# Patient Record
Sex: Female | Born: 1970 | Hispanic: Yes | Marital: Married | Smoking: Never smoker
Health system: Southern US, Community
[De-identification: ages and names within clinical notes are randomized; demographics above are authoritative.]

## PROBLEM LIST (undated history)

## (undated) DIAGNOSIS — E119 Type 2 diabetes mellitus without complications: Secondary | ICD-10-CM

## (undated) HISTORY — DX: Type 2 diabetes mellitus without complications: E11.9

---

## 2006-04-09 ENCOUNTER — Emergency Department: Payer: Self-pay | Admitting: Emergency Medicine

## 2006-06-05 ENCOUNTER — Emergency Department: Payer: Self-pay | Admitting: Emergency Medicine

## 2009-06-13 HISTORY — PX: GALLBLADDER SURGERY: SHX652

## 2013-03-13 ENCOUNTER — Ambulatory Visit: Payer: Self-pay

## 2013-04-26 ENCOUNTER — Ambulatory Visit: Payer: Self-pay

## 2013-10-08 ENCOUNTER — Ambulatory Visit: Payer: Self-pay | Admitting: Internal Medicine

## 2013-10-25 ENCOUNTER — Ambulatory Visit: Payer: Self-pay

## 2015-01-23 IMAGING — US US BREAST*L* LIMITED INC AXILLA
1 series · 7 of 7 positions shown · non-contrast
Comparison: 04/26/2013, 03/13/2013

CLINICAL DATA: Six-month followup of probably benign left breast
masses

EXAM:
DIGITAL DIAGNOSTIC  LEFT MAMMOGRAM WITH CAD
ULTRASOUND LEFT BREAST

[Series 1: us breast*left* limited inc axilla · 0.08mm/px · 7 of 7 slices shown]
[im 1/7]
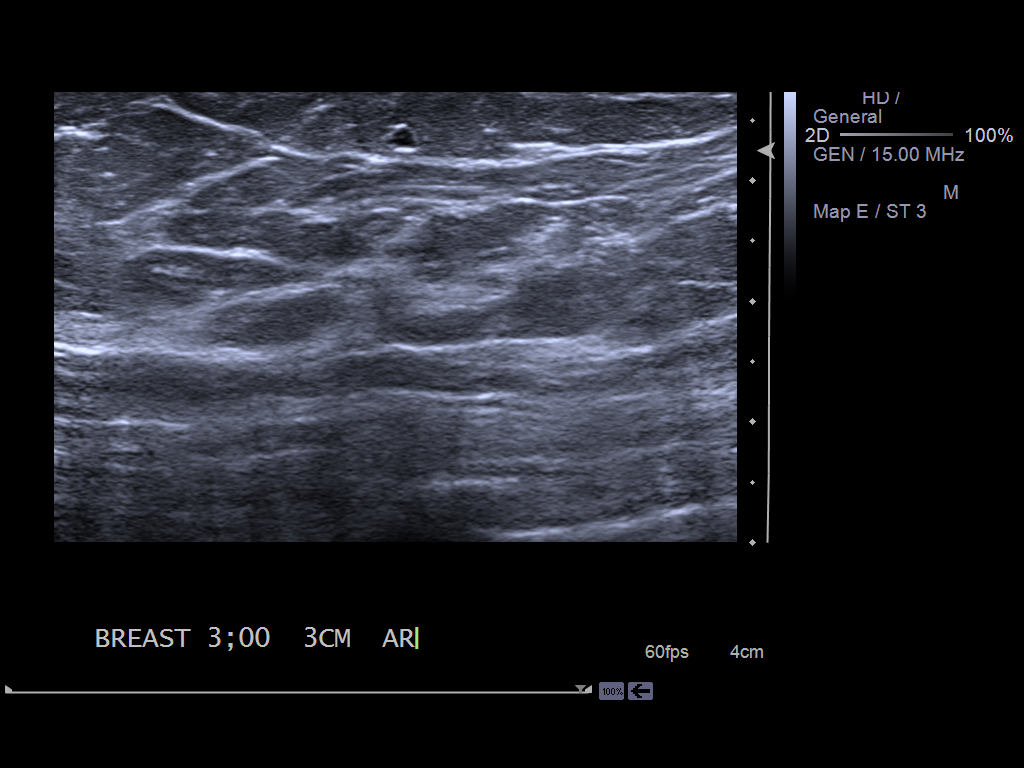
[im 2/7]
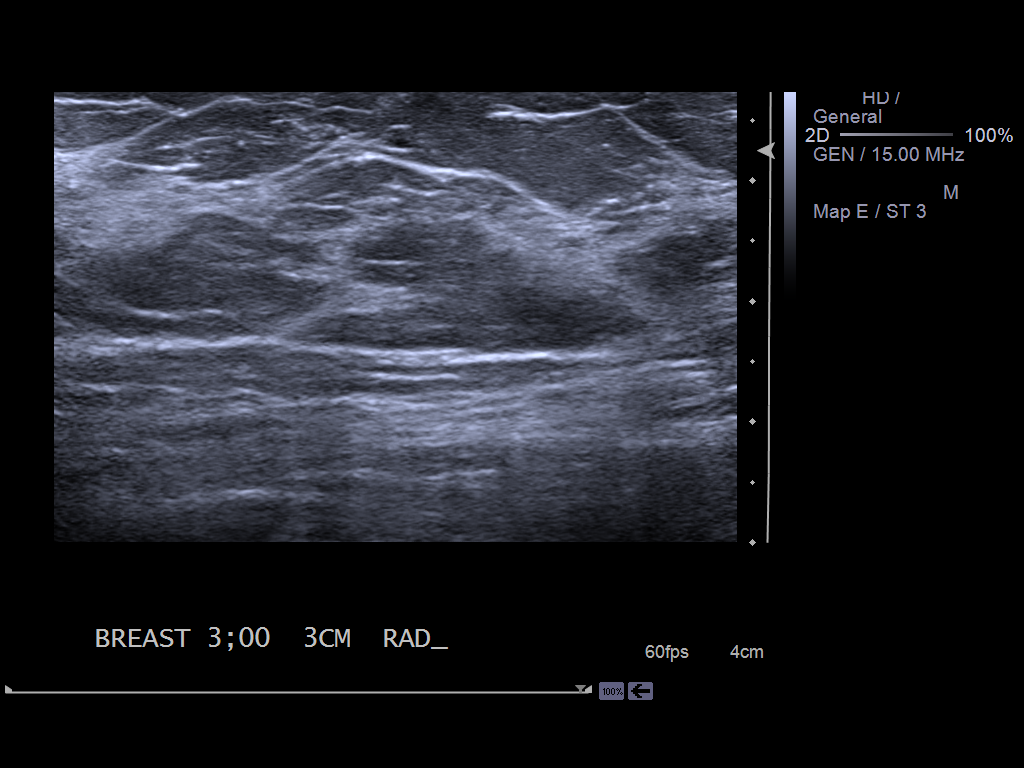
[im 3/7]
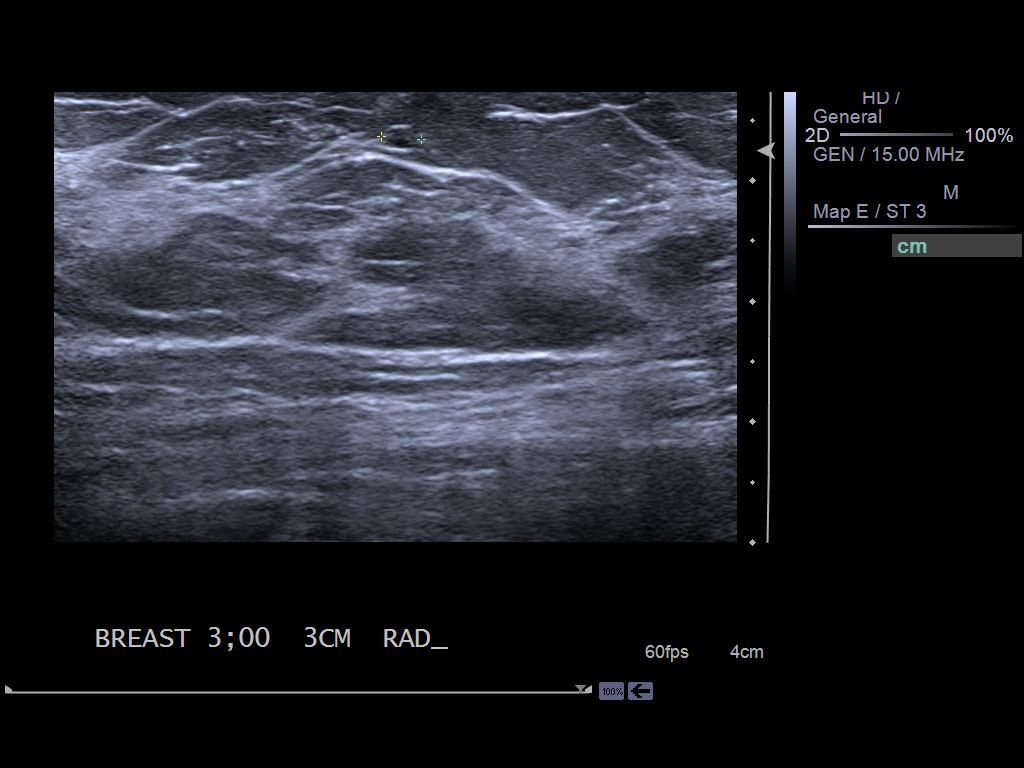
[im 4/7]
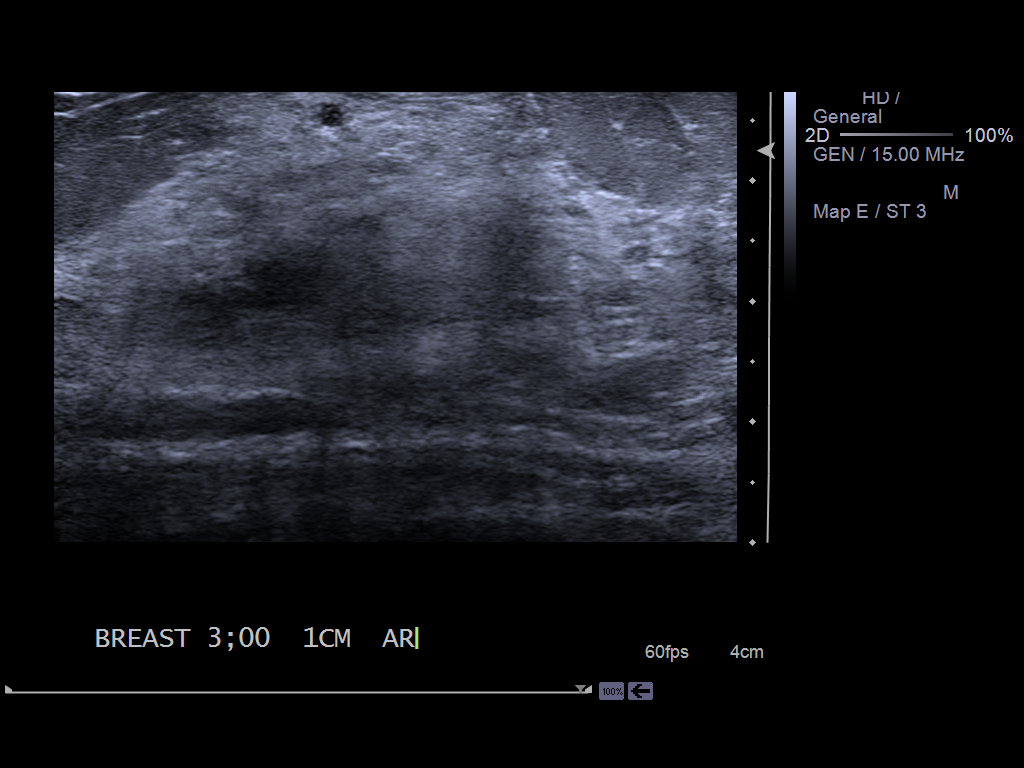
[im 5/7]
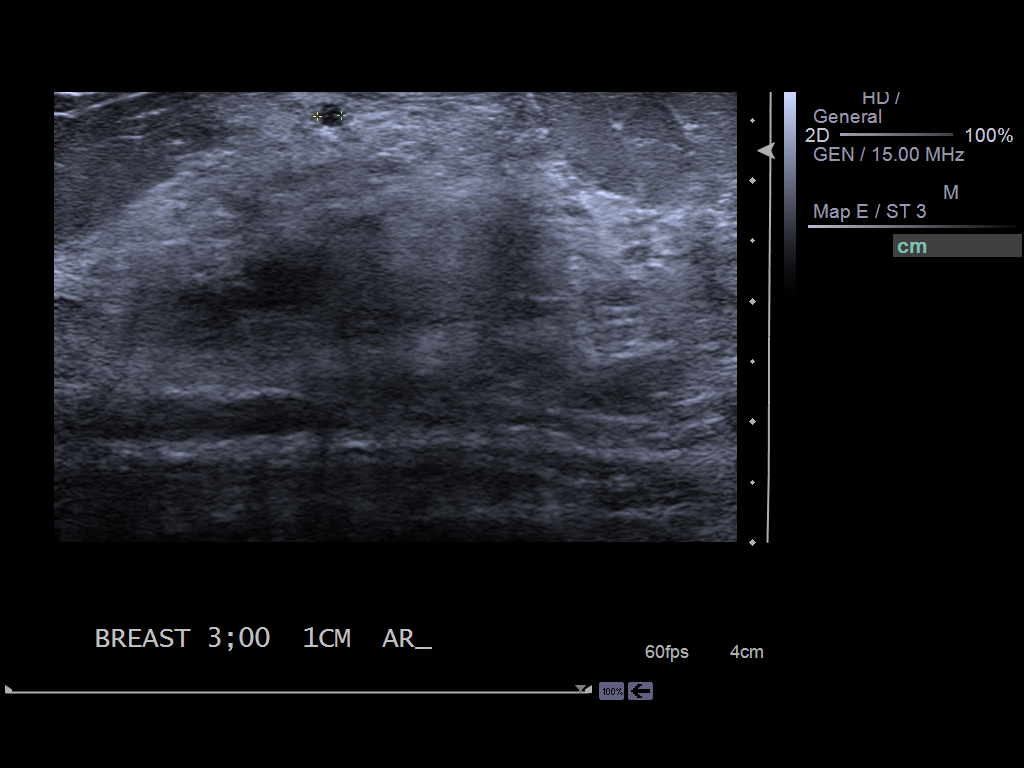
[im 6/7]
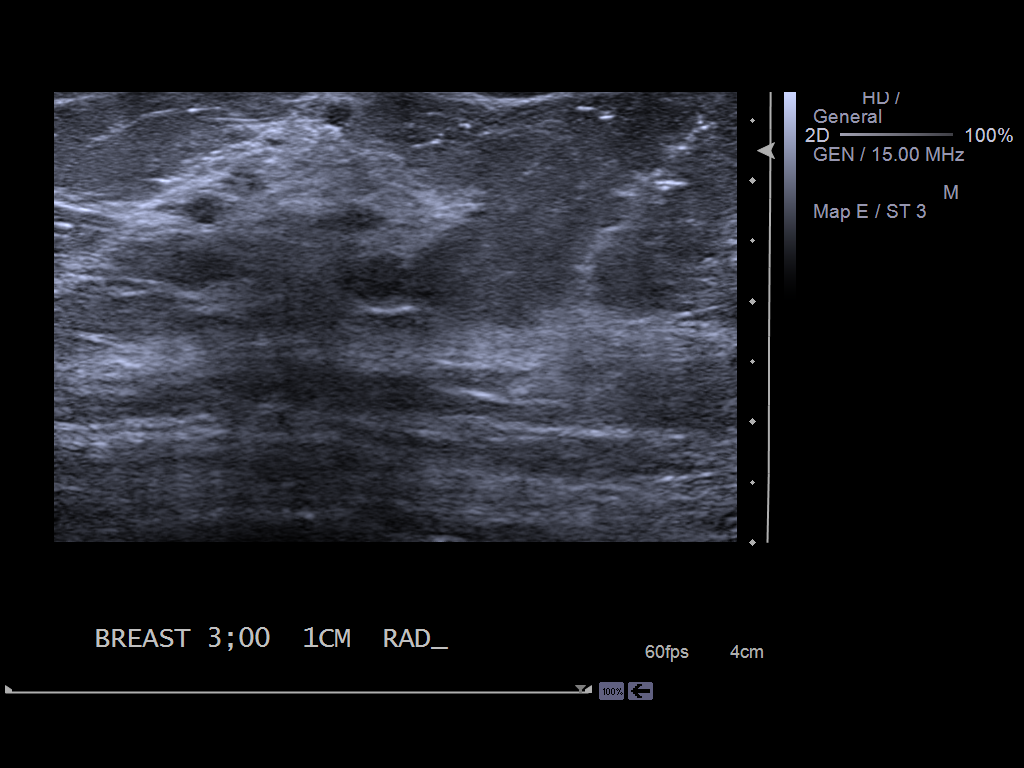
[im 7/7]
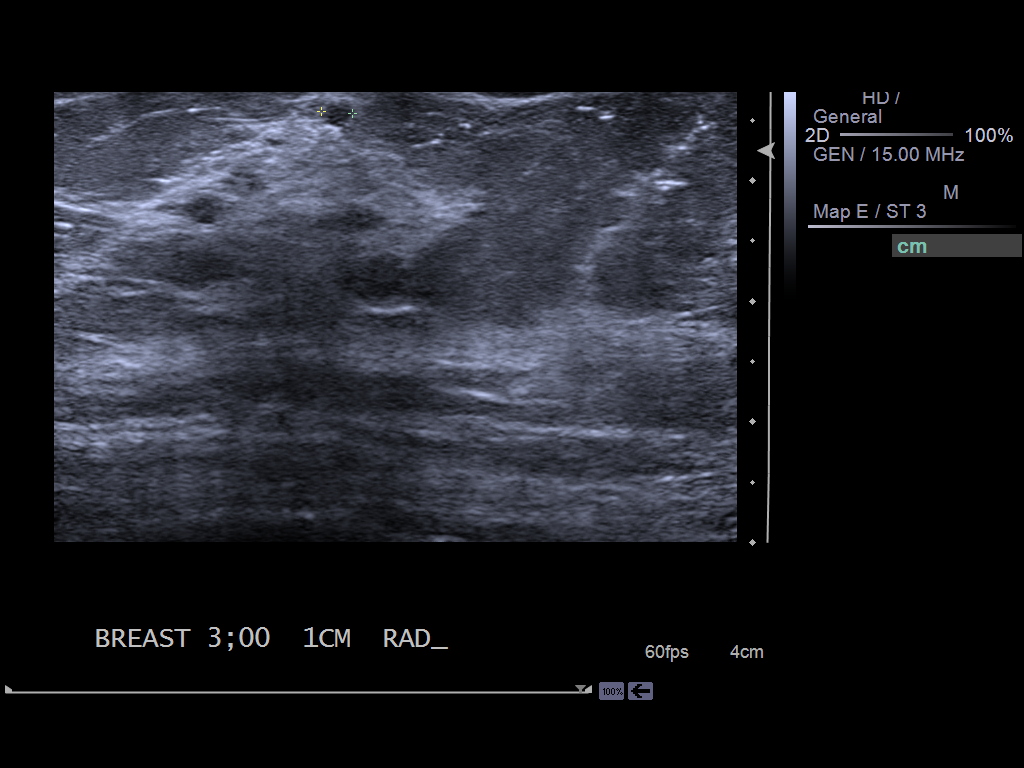

[7 of 7 positions shown; findings below may reference images not displayed]

ACR Breast Density Category b: There are scattered areas of
fibroglandular density.
FINDINGS: Left mammogram is negative. Small upper-outer obscured mass seen on
prior mammogram not seen on the current mammogram.

Mammographic images were processed with CAD.

On physical exam, there are no palpable abnormalities.

Ultrasound is performed, showing fibrocystic change throughout the
left upper outer quadrant. 1 cm from the nipple in 3 o'clock
position of the left breast is a tiny hypoechoic nodule measuring 3
mm in diameter. 3 cm from the nipple is another tiny hypoechoic
nodule measuring 3 mm in diameter. These nodules appear most
consistent with tiny given increased sound transmission,
circumscribed borders, and well-defined echogenic back wall.
IMPRESSION: Findings most consistent with 2 stable tiny minimally complicated
cysts on a background of fibrocystic change.

RECOMMENDATION:
Diagnostic bilateral mammogram and left ultrasound in 1 year

I have discussed the findings and recommendations with the patient.
Results were also provided in writing at the conclusion of the
visit. If applicable, a reminder letter will be sent to the patient
regarding the next appointment.

BI-RADS CATEGORY  3: Probably benign.

## 2021-10-22 ENCOUNTER — Ambulatory Visit (INDEPENDENT_AMBULATORY_CARE_PROVIDER_SITE_OTHER): Payer: 59 | Admitting: Nurse Practitioner

## 2021-10-22 ENCOUNTER — Encounter: Payer: Self-pay | Admitting: Nurse Practitioner

## 2021-10-22 VITALS — BP 138/79 | HR 93 | Temp 98.4°F | Resp 16 | Ht 60.0 in | Wt 139.4 lb

## 2021-10-22 DIAGNOSIS — E538 Deficiency of other specified B group vitamins: Secondary | ICD-10-CM

## 2021-10-22 DIAGNOSIS — Z7689 Persons encountering health services in other specified circumstances: Secondary | ICD-10-CM

## 2021-10-22 DIAGNOSIS — E559 Vitamin D deficiency, unspecified: Secondary | ICD-10-CM | POA: Diagnosis not present

## 2021-10-22 DIAGNOSIS — E1165 Type 2 diabetes mellitus with hyperglycemia: Secondary | ICD-10-CM

## 2021-10-22 DIAGNOSIS — N920 Excessive and frequent menstruation with regular cycle: Secondary | ICD-10-CM | POA: Diagnosis not present

## 2021-10-22 DIAGNOSIS — D259 Leiomyoma of uterus, unspecified: Secondary | ICD-10-CM

## 2021-10-22 DIAGNOSIS — E782 Mixed hyperlipidemia: Secondary | ICD-10-CM

## 2021-10-22 DIAGNOSIS — E119 Type 2 diabetes mellitus without complications: Secondary | ICD-10-CM

## 2021-10-22 LAB — POCT GLYCOSYLATED HEMOGLOBIN (HGB A1C): Hemoglobin A1C: 11.6 % — AB (ref 4.0–5.6)

## 2021-10-22 MED ORDER — GLIPIZIDE ER 10 MG PO TB24: 10.0000 mg | ORAL_TABLET | Freq: Every day | ORAL | 3 refills | Status: DC

## 2021-10-22 MED ORDER — METFORMIN HCL ER 750 MG PO TB24: 750.0000 mg | ORAL_TABLET | Freq: Every day | ORAL | 5 refills | Status: DC

## 2021-10-22 MED ORDER — ACCU-CHEK GUIDE W/DEVICE KIT: 1.0000 | PACK | Freq: Once | 0 refills | Status: AC

## 2021-10-22 NOTE — Progress Notes (Signed)
[facility] [place], [place] [address]  Internal MEDICINE  Office Visit Note  Patient Name: Grace Pena  [id number]  [id number]  Date of Service: 10/22/2021   Complaints/HPI Pt is here for establishment of PCP. Chief Complaint  Patient presents with   New Patient (Initial Visit)   Menstrual Problem    Has very painful, inconsistent periods   Quality Metric Gaps    Colonoscopy, [place]   Diabetes    Has dry mouth, needs water constantly   HPI Grace Pena presents for a new patient visit to establish care.  She is a well-appearing 51 year old female with diabetes and inconsistent medical care.  The patient is Spanish-speaking only and is accompanied by her daughter to the visit today who assisted in translation and interpretation.  The patient has no other significant pre-existing medical conditions and the only surgeries that she has had is 1 cesarean section and a cholecystectomy.  The patient reports that she was previously diagnosed with type 2 diabetes a long time ago and states that it went away and she feels like it has come back now.  She reports persistent increased thirst, increased urination and increased appetite and feeling hungry all the time.  She also reports intermittent headaches, and dry mouth.  A another problem that she has been having is painful periods with excessive bleeding during her cycle.  She reports that her periods have been regular except for a 18-monthperiod Around October last year that she did not have a period.  She reports that it lasts 4 to 5 days and that she saturates a pad every 2-3 hours and this has increased in the past few months.  She did go to the ER in January for abdominal pain and a CT scan of the abdomen and pelvis was done and it was grossly unremarkable except for a fibroid uterus and a crenulated follicle in the left ovary.  The pain that the patient is reporting she states is on the left lower abdomen and it  radiates around her left side to the left flank and back.  The CT scan was done in January so further imaging will be needed to assess the area and possibly identify specific fibroids and to see if the crenulated follicle is still in the left ovary. The patient is also due for routine labs, routine physical exam and Pap smear.  She is also due for colorectal cancer screening which will be just addressed at a later follow-up visit. Her A1c is 11.6 today which is significantly elevated and she is currently on no medications to control her diabetes.  She was given a prescription for metformin from the ER in January but she is out of that now.  She has never tried any other medications and she does not have a glucose meter at home to check her sugars with.    Current Medication: Outpatient Encounter Medications as of 10/22/2021  Medication Sig   [EXPIRED] Blood Glucose Monitoring Suppl (ACCU-CHEK GUIDE) w/Device KIT 1 Device by Does not apply route once for 1 dose.   glipiZIDE (GLUCOTROL XL) 10 MG 24 hr tablet Take 1 tablet (10 mg total) by mouth daily with breakfast.   metFORMIN (GLUCOPHAGE-XR) 750 MG 24 hr tablet Take 1 tablet (750 mg total) by mouth daily with breakfast.   No facility-administered encounter medications on file as of 10/22/2021.    Surgical History: Past Surgical History:  Procedure Laterality Date   CESAREAN SECTION  GALLBLADDER SURGERY  2011    Medical History: Past Medical History:  Diagnosis Date   Diabetes mellitus without complication ([place])     Family History: Family History  Problem Relation Age of Onset   Diabetes Mother    Diabetes Maternal Grandmother     Social History   Socioeconomic History   Marital status: Married    Spouse name: Not on file   Number of children: Not on file   Years of education: Not on file   Highest education level: Not on file  Occupational History   Not on file  Tobacco Use   Smoking status: Never   Smokeless tobacco:  Never  Substance and Sexual Activity   Alcohol use: Never   Drug use: Never   Sexual activity: Not on file  Other Topics Concern   Not on file  Social History Narrative   Not on file   Social Determinants of Health   Financial Resource Strain: Not on file  Food Insecurity: Not on file  Transportation Needs: Not on file  Physical Activity: Not on file  Stress: Not on file  Social Connections: Not on file  Intimate Partner Violence: Not on file     Review of Systems  Constitutional:  Positive for appetite change and fatigue. Negative for chills and unexpected weight change.  HENT:  Negative for congestion, postnasal drip, rhinorrhea, sneezing and sore throat.   Eyes:  Positive for visual disturbance. Negative for redness.  Respiratory:  Negative for cough, chest tightness, shortness of breath and wheezing.   Cardiovascular: Negative.  Negative for chest pain and palpitations.  Gastrointestinal:  Negative for abdominal pain, constipation, diarrhea, nausea and vomiting.  Endocrine: Positive for polydipsia, polyphagia and polyuria.  Genitourinary:  Positive for frequency, menstrual problem and pelvic pain. Negative for dysuria.  Musculoskeletal:  Negative for arthralgias, back pain, joint swelling and neck pain.  Skin:  Negative for rash.  Neurological:  Positive for dizziness, light-headedness and headaches. Negative for tremors and numbness.  Psychiatric/Behavioral:  Negative for behavioral problems (Depression), sleep disturbance and suicidal ideas. The patient is not nervous/anxious.    Vital Signs: BP 138/79   Pulse 93   Temp 98.4 F (36.9 C)   Resp 16   Ht 5' (1.524 m)   Wt 139 lb 6.4 oz (63.2 kg)   LMP 10/07/2021 (Approximate)   SpO2 100%   BMI 27.22 kg/m    Physical Exam Vitals reviewed.  Constitutional:      General: She is not in acute distress.    Appearance: Normal appearance. She is obese. She is not ill-appearing.  HENT:     Head: Normocephalic and  atraumatic.  Eyes:     Pupils: Pupils are equal, round, and reactive to light.  Cardiovascular:     Rate and Rhythm: Normal rate and regular rhythm.  Pulmonary:     Effort: Pulmonary effort is normal. No respiratory distress.  Neurological:     Mental Status: She is alert and oriented to person, place, and time.  Psychiatric:        Mood and Affect: Mood normal.        Behavior: Behavior normal.      Assessment/Plan: 1. Uncontrolled type 2 diabetes mellitus with hyperglycemia ([place]) Patient has poorly controlled diabetes type 2.  Her A1c is 11.6 at today's office visit.  She was given a prescription for metformin in the ER in January but has not had any further medication since that prescription ran out.  Patient  does need medication to help control her blood glucose levels and ideally insulin due to the high A1c. Metformin extended release and glipizide extended release prescribed, patient will follow-up in 3 weeks and bring glucose log and will discuss adding a basal insulin for now. - POCT glycosylated hemoglobin (Hb A1C) - CMP14+EGFR - TSH + free T4 - metFORMIN (GLUCOPHAGE-XR) 750 MG 24 hr tablet; Take 1 tablet (750 mg total) by mouth daily with breakfast.  Dispense: 30 tablet; Refill: 5 - glipiZIDE (GLUCOTROL XL) 10 MG 24 hr tablet; Take 1 tablet (10 mg total) by mouth daily with breakfast.  Dispense: 30 tablet; Refill: 3 - Blood Glucose Monitoring Suppl (ACCU-CHEK GUIDE) w/Device KIT; 1 Device by Does not apply route once for 1 dose.  Dispense: 1 kit; Refill: 0  2. Menorrhagia with regular cycle Pelvic ultrasound and additional lab ordered to further evaluate excessive bleeding with regular cycle. - US PELVIS (TRANSABDOMINAL ONLY); Future - CBC with Differential/Platelet - CMP14+EGFR - B12 and Folate Panel - Iron, TIBC and Ferritin Panel - TSH + free T4  3. Uterine leiomyoma, unspecified location CT abdomen pelvis was done in the ER in January and a fibroid uterus was  identified, patient is having excessive menstrual bleeding during her cycle, ultrasound of the pelvis ordered for further evaluation.  We will consider referral to gynecology if needed. - US PELVIS (TRANSABDOMINAL ONLY); Future  4. Vitamin D deficiency Routine labs ordered - Vitamin D (25 hydroxy) - TSH + free T4  5. Mixed hyperlipidemia Routine labs ordered - CMP14+EGFR - Lipid Profile - TSH + free T4  6. B12 deficiency Routine labs - B12 and Folate Panel - Iron, TIBC and Ferritin Panel - TSH + free T4  7. Encounter to establish care with new doctor Patient establishing care, needs consistent diabetes management and further evaluation menorrhagia.  Has not had a consistent PCP recently    General Counseling: Grace Pena understanding of the findings of todays visit and agrees with plan of treatment. I have discussed any further diagnostic evaluation that may be needed or ordered today. We also reviewed her medications today. she has been encouraged to call the office with any questions or concerns that should arise related to todays visit.    Counseling:  [place] Controlled Substance Database was reviewed by me.  Orders Placed This Encounter  Procedures   US PELVIS (TRANSABDOMINAL ONLY)   CBC with Differential/Platelet   CMP14+EGFR   Lipid Profile   Vitamin D (25 hydroxy)   B12 and Folate Panel   Iron, TIBC and Ferritin Panel   TSH + free T4   POCT glycosylated hemoglobin (Hb A1C)    Meds ordered this encounter  Medications   metFORMIN (GLUCOPHAGE-XR) 750 MG 24 hr tablet    Sig: Take 1 tablet (750 mg total) by mouth daily with breakfast.    Dispense:  30 tablet    Refill:  5    Dx code E11.65   glipiZIDE (GLUCOTROL XL) 10 MG 24 hr tablet    Sig: Take 1 tablet (10 mg total) by mouth daily with breakfast.    Dispense:  30 tablet    Refill:  3    Dx code E11.65   Blood Glucose Monitoring Suppl (ACCU-CHEK GUIDE) w/Device KIT    Sig: 1 Device by Does not  apply route once for 1 dose.    Dispense:  1 kit    Refill:  0    E11.65 dx code, please provide 1 box of strips  and 1 box of lancets if possible, we can also send order for those.    Return in about 3 weeks (around 11/12/2021) for F/U, eval new med and glucose log, Ryn Peine PCP.  Time spent:30 Minutes Time spent with patient included reviewing progress notes, labs, imaging studies, and discussing plan for follow up.   [place] Controlled Substance Database was reviewed by me for overdose risk score (ORS)   This patient was seen by Jonetta Osgood, FNP-C in collaboration with Dr. Clayborn Bigness as a part of collaborative care agreement.    Kutter Schnepf R. Valetta Fuller, MSN, FNP-C Internal Medicine

## 2021-10-23 ENCOUNTER — Encounter: Payer: Self-pay | Admitting: Nurse Practitioner

## 2021-11-12 ENCOUNTER — Ambulatory Visit: Payer: 59 | Admitting: Nurse Practitioner

## 2021-11-15 ENCOUNTER — Ambulatory Visit: Payer: 59

## 2021-11-15 DIAGNOSIS — D259 Leiomyoma of uterus, unspecified: Secondary | ICD-10-CM

## 2021-11-15 DIAGNOSIS — N920 Excessive and frequent menstruation with regular cycle: Secondary | ICD-10-CM | POA: Diagnosis not present

## 2021-11-19 ENCOUNTER — Encounter: Payer: Self-pay | Admitting: Nurse Practitioner

## 2021-11-25 ENCOUNTER — Ambulatory Visit (INDEPENDENT_AMBULATORY_CARE_PROVIDER_SITE_OTHER): Payer: 59 | Admitting: Nurse Practitioner

## 2021-11-25 ENCOUNTER — Encounter: Payer: Self-pay | Admitting: Nurse Practitioner

## 2021-11-25 VITALS — BP 135/75 | HR 98 | Temp 98.5°F | Resp 16 | Ht 61.0 in | Wt 143.2 lb

## 2021-11-25 DIAGNOSIS — D259 Leiomyoma of uterus, unspecified: Secondary | ICD-10-CM

## 2021-11-25 DIAGNOSIS — M5440 Lumbago with sciatica, unspecified side: Secondary | ICD-10-CM | POA: Diagnosis not present

## 2021-11-25 DIAGNOSIS — E1165 Type 2 diabetes mellitus with hyperglycemia: Secondary | ICD-10-CM | POA: Diagnosis not present

## 2021-11-25 DIAGNOSIS — N926 Irregular menstruation, unspecified: Secondary | ICD-10-CM | POA: Diagnosis not present
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
## 2021-12-02 ENCOUNTER — Encounter: Payer: Self-pay | Admitting: Obstetrics and Gynecology

## 2021-12-02 ENCOUNTER — Ambulatory Visit (INDEPENDENT_AMBULATORY_CARE_PROVIDER_SITE_OTHER): Payer: 59 | Admitting: Obstetrics and Gynecology

## 2021-12-02 VITALS — BP 130/86 | HR 112 | Ht 61.0 in | Wt 139.2 lb

## 2021-12-02 DIAGNOSIS — N926 Irregular menstruation, unspecified: Secondary | ICD-10-CM

## 2021-12-02 DIAGNOSIS — Z7689 Persons encountering health services in other specified circumstances: Secondary | ICD-10-CM

## 2021-12-02 DIAGNOSIS — D219 Benign neoplasm of connective and other soft tissue, unspecified: Secondary | ICD-10-CM | POA: Diagnosis not present

## 2021-12-02 DIAGNOSIS — N951 Menopausal and female climacteric states: Secondary | ICD-10-CM

## 2021-12-02 DIAGNOSIS — Z780 Asymptomatic menopausal state: Secondary | ICD-10-CM | POA: Diagnosis not present

## 2021-12-02 NOTE — Progress Notes (Signed)
Patient presents today due to irregular bleeding, cramping and fibroids. She states over the past year she has gone months without having a period and then it will start again very heavily with severe cramping. History of BTL. No other questions at this time.

## 2021-12-03 LAB — FOLLICLE STIMULATING HORMONE: FSH: 8.4 m[IU]/mL

## 2021-12-21 ENCOUNTER — Encounter: Payer: Self-pay | Admitting: Obstetrics and Gynecology

## 2021-12-21 ENCOUNTER — Ambulatory Visit (INDEPENDENT_AMBULATORY_CARE_PROVIDER_SITE_OTHER): Payer: 59 | Admitting: Obstetrics and Gynecology

## 2021-12-21 VITALS — BP 125/83 | HR 97 | Ht 61.0 in | Wt 136.5 lb

## 2021-12-21 DIAGNOSIS — N926 Irregular menstruation, unspecified: Secondary | ICD-10-CM | POA: Diagnosis not present

## 2021-12-21 DIAGNOSIS — N951 Menopausal and female climacteric states: Secondary | ICD-10-CM

## 2021-12-21 DIAGNOSIS — D219 Benign neoplasm of connective and other soft tissue, unspecified: Secondary | ICD-10-CM | POA: Diagnosis not present

## 2021-12-21 MED ORDER — DESOGESTREL-ETHINYL ESTRADIOL 0.15-0.02/0.01 MG (21/5) PO TABS: 1.0000 | ORAL_TABLET | Freq: Every day | ORAL | 3 refills | Status: DC

## 2021-12-21 NOTE — Progress Notes (Signed)
Patient presents to follow-up on recent labwork. She states her last menstrual cycle was very light and short, typically she bleeds heavily for 4 days with cramping. Patient states no current menopausal symptoms. No other questions or concerns.

## 2021-12-21 NOTE — Progress Notes (Signed)
HPI:      [name] is a 51 y.o. [phone] who LMP was Patient's last menstrual period was 12/04/2021 (approximate).  Subjective:   She presents today to follow-up after being seen for menopausal symptoms and irregular cycles.  Her [place] was found to be normal-not menopausal.  She is still somewhat uncomfortable with her menopausal symptoms and would like something to improve these if possible.  She has had a menstrual period since her last visit here but prior to that her cycles were irregular.    Hx: The following portions of the patient's history were reviewed and updated as appropriate:             She  has a past medical history of Diabetes mellitus without complication ([place]). She does not have a problem list on file. She  has a past surgical history that includes Cesarean section and Gallbladder surgery (2011). Her family history includes Diabetes in her maternal grandmother and mother. She  reports that she has never smoked. She has never used smokeless tobacco. She reports that she does not drink alcohol and does not use drugs. She has a current medication list which includes the following prescription(s): desogestrel-ethinyl estradiol, glipizide, metformin, and prednisolone. She has no allergies on file.       Review of Systems:  Review of Systems  Constitutional: Denied constitutional symptoms, night sweats, recent illness, fatigue, fever, insomnia and weight loss.  Eyes: Denied eye symptoms, eye pain, photophobia, vision change and visual disturbance.  Ears/Nose/Throat/Neck: Denied ear, nose, throat or neck symptoms, hearing loss, nasal discharge, sinus congestion and sore throat.  Cardiovascular: Denied cardiovascular symptoms, arrhythmia, chest pain/pressure, edema, exercise intolerance, orthopnea and palpitations.  Respiratory: Denied pulmonary symptoms, asthma, pleuritic pain, productive sputum, cough, dyspnea and wheezing.  Gastrointestinal: Denied,  gastro-esophageal reflux, melena, nausea and vomiting.  Genitourinary: See HPI for additional information.  Musculoskeletal: Denied musculoskeletal symptoms, stiffness, swelling, muscle weakness and myalgia.  Dermatologic: Denied dermatology symptoms, rash and scar.  Neurologic: Denied neurology symptoms, dizziness, headache, neck pain and syncope.  Psychiatric: Denied psychiatric symptoms, anxiety and depression.  Endocrine: See HPI for additional information.   Meds:   Current Outpatient Medications on File Prior to Visit  Medication Sig Dispense Refill   glipiZIDE (GLUCOTROL XL) 10 MG 24 hr tablet Take 1 tablet (10 mg total) by mouth daily with breakfast. 30 tablet 3   metFORMIN (GLUCOPHAGE-XR) 750 MG 24 hr tablet Take 1 tablet (750 mg total) by mouth daily with breakfast. 30 tablet 5   PREDNISOLONE PO Take 4 mg by mouth.     No current facility-administered medications on file prior to visit.      Objective:     Vitals:   12/21/21 1051  BP: 125/83  Pulse: 97   Filed Weights   12/21/21 1051  Weight: 136 lb 8 oz (61.9 kg)              FSH not consistent with menopause          Assessment:    G3P3003 There are no problems to display for this patient.    1. Irregular menstruation   2. Fibroids   3. Symptomatic menopausal or female climacteric states     I discussed her [place] findings with her.  I believe based on this and her symptoms and her irregular cycles that she is likely in climacteric.  She would like to do something to fix her irregular cycles but especially her menopausal symptoms.  Plan:            1.  We will fix cycles with OCPs. OCPs The risks /benefits of OCPs have been explained to the patient in detail.  Product literature has been given to her where appropriate.  I have instructed her in the use of OCPs.  I have explained to the patient that OCPs are not as effective for birth control during the first month of use, and that another form of  contraception should be used during this time.  Both first-day start and Sunday start have been explained.  The risks and benefits of each was discussed.  She has been made aware of  the fact that in rare circumstances, other medications may affect the efficacy of OCPs.  I have answered all of her questions, and I believe that she has an understanding of the effectiveness and use of OCPs. We will start [place]. Orders No orders of the defined types were placed in this encounter.    Meds ordered this encounter  Medications   desogestrel-ethinyl estradiol (MIRCETTE) 0.15-0.02/0.01 MG (21/5) tablet    Sig: Take 1 tablet by mouth at bedtime.    Dispense:  28 tablet    Refill:  3      F/U  Return in about 3 months (around 03/23/2022).  Finis Bud, M.D. 12/21/2021 11:34 AM

## 2022-01-16 ENCOUNTER — Encounter: Payer: Self-pay | Admitting: Nurse Practitioner

## 2022-01-20 ENCOUNTER — Ambulatory Visit: Payer: 59 | Admitting: Nurse Practitioner

## 2022-01-25 ENCOUNTER — Ambulatory Visit (INDEPENDENT_AMBULATORY_CARE_PROVIDER_SITE_OTHER): Payer: 59 | Admitting: Nurse Practitioner

## 2022-01-25 ENCOUNTER — Encounter: Payer: Self-pay | Admitting: Nurse Practitioner

## 2022-01-25 VITALS — BP 138/80 | HR 120 | Temp 98.4°F | Resp 16 | Ht 61.0 in | Wt 134.0 lb

## 2022-01-25 DIAGNOSIS — Z1231 Encounter for screening mammogram for malignant neoplasm of breast: Secondary | ICD-10-CM

## 2022-01-25 DIAGNOSIS — E1165 Type 2 diabetes mellitus with hyperglycemia: Secondary | ICD-10-CM

## 2022-01-25 DIAGNOSIS — R300 Dysuria: Secondary | ICD-10-CM | POA: Diagnosis not present

## 2022-01-25 DIAGNOSIS — Z0001 Encounter for general adult medical examination with abnormal findings: Secondary | ICD-10-CM | POA: Diagnosis not present

## 2022-01-25 DIAGNOSIS — Z1211 Encounter for screening for malignant neoplasm of colon: Secondary | ICD-10-CM

## 2022-01-25 DIAGNOSIS — Z1212 Encounter for screening for malignant neoplasm of rectum: Secondary | ICD-10-CM

## 2022-01-25 DIAGNOSIS — M5440 Lumbago with sciatica, unspecified side: Secondary | ICD-10-CM | POA: Diagnosis not present

## 2022-01-25 LAB — POCT GLYCOSYLATED HEMOGLOBIN (HGB A1C): Hemoglobin A1C: 6.7 % — AB (ref 4.0–5.6)

## 2022-01-25 MED ORDER — METOPROLOL SUCCINATE ER 25 MG PO TB24: 25.0000 mg | ORAL_TABLET | Freq: Every day | ORAL | 2 refills | Status: DC

## 2022-01-25 MED ORDER — AMLODIPINE BESYLATE 2.5 MG PO TABS: 2.5000 mg | ORAL_TABLET | Freq: Every day | ORAL | 2 refills | Status: DC

## 2022-01-25 MED ORDER — CYCLOBENZAPRINE HCL 5 MG PO TABS: 5.0000 mg | ORAL_TABLET | Freq: Every evening | ORAL | 0 refills | Status: DC | PRN
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
## 2022-01-28 LAB — UA/M W/RFLX CULTURE, ROUTINE

## 2022-02-03 ENCOUNTER — Ambulatory Visit (INDEPENDENT_AMBULATORY_CARE_PROVIDER_SITE_OTHER): Payer: 59 | Admitting: Internal Medicine

## 2022-02-03 ENCOUNTER — Encounter: Payer: Self-pay | Admitting: Internal Medicine

## 2022-02-03 VITALS — BP 119/78 | HR 109 | Temp 97.8°F | Resp 16 | Ht 61.0 in | Wt 134.0 lb

## 2022-02-03 DIAGNOSIS — R000 Tachycardia, unspecified: Secondary | ICD-10-CM

## 2022-02-03 DIAGNOSIS — E1165 Type 2 diabetes mellitus with hyperglycemia: Secondary | ICD-10-CM | POA: Diagnosis not present

## 2022-02-03 DIAGNOSIS — N920 Excessive and frequent menstruation with regular cycle: Secondary | ICD-10-CM | POA: Diagnosis not present

## 2022-02-03 DIAGNOSIS — R5383 Other fatigue: Secondary | ICD-10-CM

## 2022-02-03 MED ORDER — EMPAGLIFLOZIN 10 MG PO TABS: 10.0000 mg | ORAL_TABLET | Freq: Every day | ORAL | 3 refills | Status: DC

## 2022-02-03 MED ORDER — MELOXICAM 15 MG PO TABS: ORAL_TABLET | ORAL | 3 refills | Status: DC

## 2022-02-03 NOTE — Progress Notes (Signed)
[facility] [place], [place] [address]  Internal MEDICINE  Office Visit Note  Patient Name: Grace Pena  [id number]  [id number]  Date of Service: 02/12/2022  Chief Complaint  Patient presents with   Follow-up   Diabetes   Fatigue    HPI Patient is seen with her daughter due to language barrier, patient speaks Spanish and daughter is here to translate Patient history of diabetes and has been on metformin and glipizide however patient started noticing with low blood sugars, there is a drastic drop in her A1c as well from 11.6-6.7, patient is on Glucophage and glipizide 10 mg once a day She has not had her labs drawn Patient is also on oral contraceptives due to dysmenorrhea and excessive bleeding which is improved Patient has been on metoprolol due to symptoms of palpitations questionable hot flashes her blood pressure is on the low side as well Complains of excessive fatigue gets tired easily    Current Medication: Outpatient Encounter Medications as of 02/03/2022  Medication Sig Note   cyclobenzaprine (FLEXERIL) 5 MG tablet Take 1-2 tablets (5-10 mg total) by mouth at bedtime as needed for muscle spasms (and musculoskeletal pain).    desogestrel-ethinyl estradiol (MIRCETTE) 0.15-0.02/0.01 MG (21/5) tablet Take 1 tablet by mouth at bedtime.    meloxicam (MOBIC) 15 MG tablet Take one tab po qd for inflammation for 3 days and then as needed    metFORMIN (GLUCOPHAGE-XR) 750 MG 24 hr tablet Take 1 tablet (750 mg total) by mouth daily with breakfast.    [DISCONTINUED] empagliflozin (JARDIANCE) 10 MG TABS tablet Take 1 tablet (10 mg total) by mouth daily before breakfast. 02/10/2022: medication to high of co-pay   [DISCONTINUED] glipiZIDE (GLUCOTROL XL) 10 MG 24 hr tablet Take 1 tablet (10 mg total) by mouth daily with breakfast.    [DISCONTINUED] metoprolol succinate (TOPROL-XL) 25 MG 24 hr tablet Take 1 tablet (25 mg total) by mouth daily with breakfast.     [DISCONTINUED] PREDNISOLONE PO Take 4 mg by mouth.    No facility-administered encounter medications on file as of 02/03/2022.    Surgical History: Past Surgical History:  Procedure Laterality Date   CESAREAN SECTION     GALLBLADDER SURGERY  2011    Medical History: Past Medical History:  Diagnosis Date   Diabetes mellitus without complication ([place])     Family History: Family History  Problem Relation Age of Onset   Diabetes Mother    Diabetes Maternal Grandmother     Social History   Socioeconomic History   Marital status: Married    Spouse name: Not on file   Number of children: Not on file   Years of education: Not on file   Highest education level: Not on file  Occupational History   Not on file  Tobacco Use   Smoking status: Never   Smokeless tobacco: Never  Vaping Use   Vaping Use: Not on file  Substance and Sexual Activity   Alcohol use: Never   Drug use: Never   Sexual activity: Yes    Birth control/protection: Surgical    Comment: BTL  Other Topics Concern   Not on file  Social History Narrative   Not on file   Social Determinants of Health   Financial Resource Strain: Not on file  Food Insecurity: Not on file  Transportation Needs: Not on file  Physical Activity: Not on file  Stress: Not on file  Social Connections: Not on file  Intimate Partner  Violence: Not on file      Review of Systems  Constitutional:  Positive for fatigue. Negative for chills and unexpected weight change.  HENT:  Negative for congestion, postnasal drip, rhinorrhea, sneezing and sore throat.   Eyes:  Negative for redness.  Respiratory:  Negative for cough, chest tightness and shortness of breath.   Cardiovascular:  Negative for chest pain and palpitations.  Gastrointestinal:  Negative for abdominal pain, constipation, diarrhea, nausea and vomiting.  Genitourinary:  Negative for dysuria and frequency.  Musculoskeletal:  Negative for arthralgias, back pain, joint  swelling and neck pain.  Skin:  Negative for rash.  Neurological: Negative.  Negative for tremors and numbness.  Hematological:  Negative for adenopathy. Does not bruise/bleed easily.  Psychiatric/Behavioral:  Negative for behavioral problems (Depression), sleep disturbance and suicidal ideas. The patient is not nervous/anxious.     Vital Signs: BP 119/78   Pulse (!) 109   Temp 97.8 F (36.6 C)   Resp 16   Ht '5\' 1"'$  (1.549 m)   Wt 134 lb (60.8 kg)   SpO2 99%   BMI 25.32 kg/m    Physical Exam Constitutional:      Appearance: Normal appearance.  HENT:     Head: Normocephalic and atraumatic.     Nose: Nose normal.     Mouth/Throat:     Mouth: Mucous membranes are moist.     Pharynx: No posterior oropharyngeal erythema.  Eyes:     Extraocular Movements: Extraocular movements intact.     Pupils: Pupils are equal, round, and reactive to light.  Cardiovascular:     Pulses: Normal pulses.     Heart sounds: Normal heart sounds.  Pulmonary:     Effort: Pulmonary effort is normal.     Breath sounds: Normal breath sounds.  Neurological:     General: No focal deficit present.     Mental Status: She is alert.  Psychiatric:        Mood and Affect: Mood normal.        Behavior: Behavior normal.        Assessment/Plan: 1. Poorly controlled diabetes mellitus ([place]) Her hemoglobin A1c improved to rather too quick, patient is experiencing hypoglycemic events.  Stop glipizide, introduce low-dose Farxiga 5 mg once a day since Jardiance is not covered through her insurance, continue metformin as before if SGLT is or not approved then we will start her on low-dose Amaryl 1 to 2 mg once a day  2. Menorrhagia with regular cycle Patient is on OCP we will get her CBC and iron levels  3. Other fatigue Patient excessive fatigue would like to get her status on hemoglobin and ferritin  4. Tachycardia Patient might have some baseline anxiety however she does have fatigue and low blood  pressure we will hold her metoprolol for now,reevaluate for further treatment reassurance is given to the patient  General Counseling: Truitt Leep understanding of the findings of todays visit and agrees with plan of treatment. I have discussed any further diagnostic evaluation that may be needed or ordered today. We also reviewed her medications today. she has been encouraged to call the office with any questions or concerns that should arise related to todays visit.    Orders Placed This Encounter  Procedures   CBC with Differential/Platelet   Lipid Panel With LDL/HDL Ratio   TSH   T4, free   Comprehensive metabolic panel   V49 and Folate Panel   Iron, TIBC and Ferritin Panel    Meds  ordered this encounter  Medications   meloxicam (MOBIC) 15 MG tablet    Sig: Take one tab po qd for inflammation for 3 days and then as needed    Dispense:  30 tablet    Refill:  3   DISCONTD: empagliflozin (JARDIANCE) 10 MG TABS tablet    Sig: Take 1 tablet (10 mg total) by mouth daily before breakfast.    Dispense:  30 tablet    Refill:  3    Total time spent:35 Minutes Time spent includes review of chart, medications, test results, and follow up plan with the patient.    Controlled Substance Database was reviewed by me.   Dr Lavera Guise Internal medicine

## 2022-02-08 ENCOUNTER — Other Ambulatory Visit: Payer: Self-pay | Admitting: Nurse Practitioner

## 2022-02-08 ENCOUNTER — Telehealth: Payer: Self-pay

## 2022-02-08 DIAGNOSIS — E1165 Type 2 diabetes mellitus with hyperglycemia: Secondary | ICD-10-CM

## 2022-02-10 ENCOUNTER — Other Ambulatory Visit: Payer: Self-pay

## 2022-02-10 LAB — COLOGUARD: COLOGUARD: NEGATIVE

## 2022-02-10 MED ORDER — DAPAGLIFLOZIN PROPANEDIOL 5 MG PO TABS: 5.0000 mg | ORAL_TABLET | Freq: Every day | ORAL | 3 refills | Status: DC

## 2022-02-10 NOTE — Telephone Encounter (Signed)
Pt c/o co-pay being high on the Jardiance and pt requested to have medication changed to something different.  Per Dr Humphrey Rolls switched pt to Farxiga 5 mg, gave pt a $0 co-pay card from mfg.  Sent in new rx for Farxiga 5 mg to pharmacy

## 2022-02-11 NOTE — Telephone Encounter (Signed)
error 

## 2022-02-14 NOTE — Progress Notes (Signed)
Please all the patient and let her know that her cologuard test was negative. Will repeat test in 3 years.

## 2022-02-15 ENCOUNTER — Telehealth: Payer: Self-pay

## 2022-02-15 NOTE — Telephone Encounter (Signed)
Called and informed pt to go have labs drawn before her next appt in October per D[facility].  Pt understood

## 2022-02-15 NOTE — Telephone Encounter (Signed)
-----   Message from Lavera Guise, MD sent at 02/12/2022 10:07 AM EDT ----- Please let pt know to have labs done before next follow up

## 2022-02-16 ENCOUNTER — Telehealth: Payer: Self-pay

## 2022-02-16 NOTE — Telephone Encounter (Signed)
-----   Message from Jonetta Osgood, NP sent at 02/14/2022  8:12 PM EDT ----- Please all the patient and let her know that her cologuard test was negative. Will repeat test in 3 years.

## 2022-02-16 NOTE — Telephone Encounter (Signed)
Spoke with patient regarding negative Cologuard test result.

## 2022-02-28 ENCOUNTER — Telehealth: Payer: Self-pay

## 2022-02-28 NOTE — Telephone Encounter (Signed)
Pt called and advised that the [place] has a high co-pay also and the pt is going to try the discount card again maybe tomorrow.  I also informed pt that we can give her samples of the farxiga 10 mg and she can break them in half.  I requested that pt called her insurance and find out which diabetic medications that they will cover at a low copay that she can afford and once she finds out she can let us know and we decide on what is the best for her.  Pt agreed to do that and also she has changed insurance to [facility] and will give a copy of her card next visit.  Placed 2 boxes of FARXIGA 10 MG samples up front for pt to pick up.  Gave instructions with samples to cut the tablet in half to take 5 MG.  We will see what her insurance gives her on what diabetic medications they cover before we try to change to a different medication

## 2022-03-02 ENCOUNTER — Telehealth: Payer: Self-pay

## 2022-03-02 NOTE — Telephone Encounter (Signed)
hey I tried to call pt back after speaking to the pharmacy about her medication.  her insurance does cover the [facility] but she has a deductible on her insurance and that is why the co-pay is $554.53 and the Vania Rea is $581.96.  her deductible is $1500.00  pharmacist stated that the glyburide and the Glipizide may be the cheapest and also the metformin.  so not sure but can you get with pt and also ask [facility] what can be done until pt reached deductible or whichever they think is best.  pt is advising that she needs her meds bc her BS are getting high.  I also place 2 boxes of the FARXIGA 10 mg up front for pt and informed her also and that she needs to cut tablet in half to take only 5 mg.  pt understood

## 2022-03-03 ENCOUNTER — Other Ambulatory Visit: Payer: Self-pay

## 2022-03-03 ENCOUNTER — Telehealth: Payer: Self-pay

## 2022-03-03 DIAGNOSIS — D219 Benign neoplasm of connective and other soft tissue, unspecified: Secondary | ICD-10-CM

## 2022-03-03 DIAGNOSIS — N951 Menopausal and female climacteric states: Secondary | ICD-10-CM

## 2022-03-03 MED ORDER — METFORMIN HCL ER 500 MG PO TB24: ORAL_TABLET | ORAL | 0 refills | Status: DC

## 2022-03-03 MED ORDER — GLIMEPIRIDE 2 MG PO TABS: 2.0000 mg | ORAL_TABLET | Freq: Every day | ORAL | 0 refills | Status: DC

## 2022-03-03 NOTE — Telephone Encounter (Signed)
-----   Message from Lavera Guise, MD sent at 03/03/2022  9:07 AM EDT ----- Can we do metformin 500 mg er in pm and amaryl 2 mg in am for now

## 2022-03-03 NOTE — Telephone Encounter (Signed)
As per dr Humphrey Rolls advised pt that we send metformin and Amaryl to [facility] and don't need come pickup farxiga

## 2022-03-19 ENCOUNTER — Other Ambulatory Visit: Payer: Self-pay | Admitting: Obstetrics and Gynecology

## 2022-03-19 DIAGNOSIS — D219 Benign neoplasm of connective and other soft tissue, unspecified: Secondary | ICD-10-CM

## 2022-03-19 DIAGNOSIS — N951 Menopausal and female climacteric states: Secondary | ICD-10-CM

## 2022-03-26 ENCOUNTER — Encounter: Payer: Self-pay | Admitting: Nurse Practitioner

## 2022-03-29 ENCOUNTER — Encounter: Payer: 59 | Admitting: Obstetrics and Gynecology

## 2022-04-05 ENCOUNTER — Encounter: Payer: Self-pay | Admitting: Obstetrics and Gynecology

## 2022-04-05 DIAGNOSIS — N951 Menopausal and female climacteric states: Secondary | ICD-10-CM

## 2022-04-05 DIAGNOSIS — N926 Irregular menstruation, unspecified: Secondary | ICD-10-CM

## 2022-04-07 ENCOUNTER — Encounter: Payer: Self-pay | Admitting: Nurse Practitioner

## 2022-04-07 ENCOUNTER — Ambulatory Visit: Payer: BLUE CROSS/BLUE SHIELD | Admitting: Nurse Practitioner

## 2022-04-07 VITALS — BP 138/77 | HR 98 | Temp 98.0°F | Resp 16 | Ht 61.0 in | Wt 142.0 lb

## 2022-04-07 DIAGNOSIS — E1165 Type 2 diabetes mellitus with hyperglycemia: Secondary | ICD-10-CM

## 2022-04-07 DIAGNOSIS — R030 Elevated blood-pressure reading, without diagnosis of hypertension: Secondary | ICD-10-CM | POA: Diagnosis not present

## 2022-04-07 DIAGNOSIS — M5440 Lumbago with sciatica, unspecified side: Secondary | ICD-10-CM | POA: Diagnosis not present

## 2022-04-07 NOTE — Progress Notes (Signed)
[facility] [place], [place] [address]  Internal MEDICINE  Office Visit Note  Patient Name: Grace Pena  [id number]  [id number]  Date of Service: 04/07/2022  Chief Complaint  Patient presents with   Follow-up   Diabetes    HPI Grace Pena presents for a follow up visit for diabetes, hypertension, and back pain with sciatica.  Glucose range at home -- 100-140, sometimes 160 Metformin ER in pm and glimepiride in am.  Palpitations resolved Left leg burning some with sciatica -- but better than it was  happens when overworking self.  BP is ok.     Current Medication: Outpatient Encounter Medications as of 04/07/2022  Medication Sig   cyclobenzaprine (FLEXERIL) 5 MG tablet Take 1-2 tablets (5-10 mg total) by mouth at bedtime as needed for muscle spasms (and musculoskeletal pain).   glimepiride (AMARYL) 2 MG tablet Take 1 tablet (2 mg total) by mouth daily with breakfast.   meloxicam (MOBIC) 15 MG tablet Take one tab po qd for inflammation for 3 days and then as needed   VOLNEA 0.15-0.02/0.01 MG (21/5) tablet TAKE 1 TABLET BY MOUTH AT BEDTIME   [DISCONTINUED] metFORMIN (GLUCOPHAGE-XR) 500 MG 24 hr tablet Take 1 tab po with supper   No facility-administered encounter medications on file as of 04/07/2022.    Surgical History: Past Surgical History:  Procedure Laterality Date   CESAREAN SECTION     GALLBLADDER SURGERY  2011    Medical History: Past Medical History:  Diagnosis Date   Diabetes mellitus without complication ([place])     Family History: Family History  Problem Relation Age of Onset   Diabetes Mother    Diabetes Maternal Grandmother     Social History   Socioeconomic History   Marital status: Married    Spouse name: Not on file   Number of children: Not on file   Years of education: Not on file   Highest education level: Not on file  Occupational History   Not on file  Tobacco Use   Smoking status: Never   Smokeless  tobacco: Never  Vaping Use   Vaping Use: Not on file  Substance and Sexual Activity   Alcohol use: Never   Drug use: Never   Sexual activity: Yes    Birth control/protection: Surgical    Comment: BTL  Other Topics Concern   Not on file  Social History Narrative   Not on file   Social Determinants of Health   Financial Resource Strain: Not on file  Food Insecurity: Not on file  Transportation Needs: Not on file  Physical Activity: Not on file  Stress: Not on file  Social Connections: Not on file  Intimate Partner Violence: Not on file      Review of Systems  Constitutional:  Positive for fatigue. Negative for chills and unexpected weight change.  HENT:  Negative for congestion, postnasal drip, rhinorrhea, sneezing and sore throat.   Eyes:  Negative for redness.  Respiratory:  Negative for cough, chest tightness and shortness of breath.   Cardiovascular:  Negative for chest pain and palpitations.  Gastrointestinal:  Negative for abdominal pain, constipation, diarrhea, nausea and vomiting.  Genitourinary:  Negative for dysuria and frequency.  Musculoskeletal:  Negative for arthralgias, back pain, joint swelling and neck pain.  Skin:  Negative for rash.  Neurological: Negative.  Negative for tremors and numbness.  Hematological:  Negative for adenopathy. Does not bruise/bleed easily.  Psychiatric/Behavioral:  Negative for behavioral problems (Depression), sleep disturbance  and suicidal ideas. The patient is not nervous/anxious.     Vital Signs: BP 138/77   Pulse 98   Temp 98 F (36.7 C)   Resp 16   Ht '5\' 1"'$  (1.549 m)   Wt 142 lb (64.4 kg)   SpO2 98%   BMI 26.83 kg/m    Physical Exam Vitals reviewed.  Constitutional:      Appearance: Normal appearance.  HENT:     Head: Normocephalic and atraumatic.     Nose: Nose normal.     Mouth/Throat:     Mouth: Mucous membranes are moist.     Pharynx: No posterior oropharyngeal erythema.  Eyes:     Extraocular  Movements: Extraocular movements intact.     Pupils: Pupils are equal, round, and reactive to light.  Cardiovascular:     Pulses: Normal pulses.     Heart sounds: Normal heart sounds.  Pulmonary:     Effort: Pulmonary effort is normal.     Breath sounds: Normal breath sounds.  Neurological:     General: No focal deficit present.     Mental Status: She is alert and oriented to person, place, and time.  Psychiatric:        Mood and Affect: Mood normal.        Behavior: Behavior normal.        Assessment/Plan: 1. Poorly controlled diabetes mellitus ([place]) Stable, continue currernt medications as prescribed and discussed  2. Elevated blood pressure reading in office without diagnosis of hypertension Stable, continue to monitor  3. Back pain of lumbar region with sciatica Improving and tolerable.    General Counseling: Grace Pena understanding of the findings of todays visit and agrees with plan of treatment. I have discussed any further diagnostic evaluation that may be needed or ordered today. We also reviewed her medications today. she has been encouraged to call the office with any questions or concerns that should arise related to todays visit.    No orders of the defined types were placed in this encounter.   No orders of the defined types were placed in this encounter.   Return in about 3 weeks (around 04/28/2022) for F/U, Recheck A1C, Grace Pena PCP has to be after 11/15.   Total time spent:30 Minutes Time spent includes review of chart, medications, test results, and follow up plan with the patient.   Grace Pena Controlled Substance Database was reviewed by me.  This patient was seen by Grace Osgood, FNP-C in collaboration with Dr. Clayborn Pena as a part of collaborative care agreement.   Grace Strayer R. Grace Fuller, MSN, FNP-C Internal medicine

## 2022-04-10 ENCOUNTER — Other Ambulatory Visit: Payer: Self-pay | Admitting: Nurse Practitioner

## 2022-04-10 DIAGNOSIS — E1165 Type 2 diabetes mellitus with hyperglycemia: Secondary | ICD-10-CM

## 2022-04-13 ENCOUNTER — Other Ambulatory Visit: Payer: Self-pay | Admitting: Nurse Practitioner

## 2022-04-13 MED ORDER — METFORMIN HCL ER 500 MG PO TB24: ORAL_TABLET | ORAL | 0 refills | Status: DC

## 2022-05-02 ENCOUNTER — Other Ambulatory Visit: Payer: Self-pay | Admitting: Nurse Practitioner

## 2022-05-03 ENCOUNTER — Ambulatory Visit: Payer: BLUE CROSS/BLUE SHIELD | Admitting: Nurse Practitioner

## 2022-05-22 ENCOUNTER — Encounter: Payer: Self-pay | Admitting: Nurse Practitioner

## 2022-05-30 ENCOUNTER — Other Ambulatory Visit: Payer: Self-pay | Admitting: Internal Medicine

## 2022-07-08 ENCOUNTER — Ambulatory Visit (INDEPENDENT_AMBULATORY_CARE_PROVIDER_SITE_OTHER): Payer: BLUE CROSS/BLUE SHIELD | Admitting: Nurse Practitioner

## 2022-07-08 ENCOUNTER — Encounter: Payer: Self-pay | Admitting: Nurse Practitioner

## 2022-07-08 VITALS — BP 139/75 | HR 91 | Temp 97.1°F | Resp 16 | Ht 61.0 in | Wt 148.2 lb

## 2022-07-08 DIAGNOSIS — E1165 Type 2 diabetes mellitus with hyperglycemia: Secondary | ICD-10-CM

## 2022-07-08 DIAGNOSIS — R5383 Other fatigue: Secondary | ICD-10-CM | POA: Diagnosis not present

## 2022-07-08 DIAGNOSIS — R200 Anesthesia of skin: Secondary | ICD-10-CM | POA: Diagnosis not present

## 2022-07-08 LAB — POCT GLYCOSYLATED HEMOGLOBIN (HGB A1C): Hemoglobin A1C: 8.3 % — AB (ref 4.0–5.6)

## 2022-07-08 MED ORDER — GLIMEPIRIDE 2 MG PO TABS: ORAL_TABLET | ORAL | 1 refills | Status: DC

## 2022-07-08 MED ORDER — METFORMIN HCL ER 500 MG PO TB24: ORAL_TABLET | ORAL | 1 refills | Status: DC

## 2022-07-08 NOTE — Progress Notes (Unsigned)
[facility] [place],  [address]  Internal MEDICINE  Office Visit Note  Patient Name: Grace Pena  [id number]  [id number]  Date of Service: 07/08/2022  Chief Complaint  Patient presents with   Follow-up   Diabetes    HPI Grace Pena presents for a follow-up visit for Diabetes -- A1c elevated at 8.3, ran out of medications.  Weight gain of about 8 lbs  Episodes of losing her voice, unable to talk or move, last 2-3 hours and then she would go to sleep. Has happened 5 times. Has not happened in 2 weeks now. Wants to go to neurologist if it happens again.     Current Medication: Outpatient Encounter Medications as of 07/08/2022  Medication Sig   cyclobenzaprine (FLEXERIL) 5 MG tablet Take 1-2 tablets (5-10 mg total) by mouth at bedtime as needed for muscle spasms (and musculoskeletal pain).   meloxicam (MOBIC) 15 MG tablet Take one tab po qd for inflammation for 3 days and then as needed   VOLNEA 0.15-0.02/0.01 MG (21/5) tablet TAKE 1 TABLET BY MOUTH AT BEDTIME   [DISCONTINUED] glimepiride (AMARYL) 2 MG tablet TAKE 1 TABLET(2 MG) BY MOUTH DAILY WITH BREAKFAST   [DISCONTINUED] metFORMIN (GLUCOPHAGE-XR) 500 MG 24 hr tablet TAKE 1 TABLET BY MOUTH EVERY DAY WITH SUPPER   glimepiride (AMARYL) 2 MG tablet TAKE 1 TABLET(2 MG) BY MOUTH DAILY WITH BREAKFAST   metFORMIN (GLUCOPHAGE-XR) 500 MG 24 hr tablet TAKE 1 TABLET BY MOUTH EVERY DAY WITH SUPPER   No facility-administered encounter medications on file as of 07/08/2022.    Surgical History: Past Surgical History:  Procedure Laterality Date   CESAREAN SECTION     GALLBLADDER SURGERY  2011    Medical History: Past Medical History:  Diagnosis Date   Diabetes mellitus without complication ([place])     Family History: Family History  Problem Relation Age of Onset   Diabetes Mother    Diabetes Maternal Grandmother     Social History   Socioeconomic History   Marital status: Married    Spouse  name: Not on file   Number of children: Not on file   Years of education: Not on file   Highest education level: Not on file  Occupational History   Not on file  Tobacco Use   Smoking status: Never   Smokeless tobacco: Never  Vaping Use   Vaping Use: Not on file  Substance and Sexual Activity   Alcohol use: Never   Drug use: Never   Sexual activity: Yes    Birth control/protection: Surgical    Comment: BTL  Other Topics Concern   Not on file  Social History Narrative   Not on file   Social Determinants of Health   Financial Resource Strain: Not on file  Food Insecurity: Not on file  Transportation Needs: Not on file  Physical Activity: Not on file  Stress: Not on file  Social Connections: Not on file  Intimate Partner Violence: Not on file      Review of Systems  Vital Signs: BP 139/75   Pulse 91   Temp (!) 97.1 F (36.2 C)   Resp 16   Ht '5\' 1"'$  (1.549 m)   Wt 148 lb 3.2 oz (67.2 kg)   SpO2 97%   BMI 28.00 kg/m    Physical Exam     Assessment/Plan:   General Counseling: Grace Pena verbalizes understanding of the findings of todays visit and agrees with plan of treatment. I have discussed  any further diagnostic evaluation that may be needed or ordered today. We also reviewed her medications today. she has been encouraged to call the office with any questions or concerns that should arise related to todays visit.    Orders Placed This Encounter  Procedures   POCT glycosylated hemoglobin (Hb A1C)    Meds ordered this encounter  Medications   glimepiride (AMARYL) 2 MG tablet    Sig: TAKE 1 TABLET(2 MG) BY MOUTH DAILY WITH BREAKFAST    Dispense:  90 tablet    Refill:  1   metFORMIN (GLUCOPHAGE-XR) 500 MG 24 hr tablet    Sig: TAKE 1 TABLET BY MOUTH EVERY DAY WITH SUPPER    Dispense:  90 tablet    Refill:  1    Return in about 3 months (around 10/11/2022) for F/U, Recheck A1C, Grace Pena PCP.   Total time spent:*** Minutes Time spent includes review  of chart, medications, test results, and follow up plan with the patient.   [place] Controlled Substance Database was reviewed by me.  This patient was seen by Grace Osgood, FNP-C in collaboration with Grace Pena as a part of collaborative care agreement.   Grace Roehrs R. Valetta Fuller, MSN, FNP-C Internal medicine

## 2022-07-09 ENCOUNTER — Encounter: Payer: Self-pay | Admitting: Nurse Practitioner

## 2022-10-10 ENCOUNTER — Ambulatory Visit: Payer: BLUE CROSS/BLUE SHIELD | Admitting: Nurse Practitioner

## 2022-10-11 ENCOUNTER — Telehealth: Payer: Self-pay | Admitting: Nurse Practitioner

## 2022-10-11 NOTE — Telephone Encounter (Signed)
S/w pt family member they stated [place] is [place] the [place] and when she comes back she will resch appt-nm

## 2022-11-28 ENCOUNTER — Ambulatory Visit (INDEPENDENT_AMBULATORY_CARE_PROVIDER_SITE_OTHER): Payer: BLUE CROSS/BLUE SHIELD | Admitting: Nurse Practitioner

## 2022-11-28 ENCOUNTER — Encounter: Payer: Self-pay | Admitting: Nurse Practitioner

## 2022-11-28 VITALS — BP 136/82 | HR 100 | Temp 98.1°F | Resp 16 | Ht 61.0 in | Wt 156.6 lb

## 2022-11-28 DIAGNOSIS — M5440 Lumbago with sciatica, unspecified side: Secondary | ICD-10-CM

## 2022-11-28 DIAGNOSIS — E1165 Type 2 diabetes mellitus with hyperglycemia: Secondary | ICD-10-CM

## 2022-11-28 DIAGNOSIS — Z23 Encounter for immunization: Secondary | ICD-10-CM

## 2022-11-28 DIAGNOSIS — G63 Polyneuropathy in diseases classified elsewhere: Secondary | ICD-10-CM

## 2022-11-28 DIAGNOSIS — E349 Endocrine disorder, unspecified: Secondary | ICD-10-CM | POA: Diagnosis not present

## 2022-11-28 LAB — POCT GLYCOSYLATED HEMOGLOBIN (HGB A1C): Hemoglobin A1C: 7.1 % — AB (ref 4.0–5.6)

## 2022-11-28 MED ORDER — METFORMIN HCL ER 500 MG PO TB24: ORAL_TABLET | ORAL | 1 refills | Status: DC

## 2022-11-28 MED ORDER — TETANUS-DIPHTH-ACELL PERTUSSIS 5-2.5-18.5 LF-MCG/0.5 IM SUSP: 0.5000 mL | Freq: Once | INTRAMUSCULAR | 0 refills | Status: AC

## 2022-11-28 MED ORDER — GABAPENTIN 100 MG PO CAPS: 100.0000 mg | ORAL_CAPSULE | Freq: Three times a day (TID) | ORAL | 5 refills | Status: DC

## 2022-11-28 MED ORDER — GLIMEPIRIDE 2 MG PO TABS: ORAL_TABLET | ORAL | 1 refills | Status: DC

## 2022-11-28 NOTE — Addendum Note (Signed)
Addended by: Norva Pavlov on: 11/28/2022 03:53 PM   Modules accepted: Orders

## 2022-11-28 NOTE — Progress Notes (Signed)
[facility] [address] [place], [place] [address]  Internal MEDICINE  Office Visit Note  Patient Name: Grace Pena  [id number]  [id number]  Date of Service: 11/28/2022  Chief Complaint  Patient presents with   Diabetes   Follow-up    HPI Doranne presents for a follow-up visit for diabetes and neuropathy.  Diabetes -- A1c improving, down to 7.1 today. Taking metformin and glimepiride Neuropathy -- burning pain and numbness and tingling in right leg and numbness in hands off and on.  Low back pain -- resolved, no longer taking meloxicam or cyclobenzaprine    Current Medication: Outpatient Encounter Medications as of 11/28/2022  Medication Sig   gabapentin (NEURONTIN) 100 MG capsule Take 1 capsule (100 mg total) by mouth 3 (three) times daily.   VOLNEA 0.15-0.02/0.01 MG (21/5) tablet TAKE 1 TABLET BY MOUTH AT BEDTIME   [DISCONTINUED] cyclobenzaprine (FLEXERIL) 5 MG tablet Take 1-2 tablets (5-10 mg total) by mouth at bedtime as needed for muscle spasms (and musculoskeletal pain).   [DISCONTINUED] glimepiride (AMARYL) 2 MG tablet TAKE 1 TABLET(2 MG) BY MOUTH DAILY WITH BREAKFAST   [DISCONTINUED] meloxicam (MOBIC) 15 MG tablet Take one tab po qd for inflammation for 3 days and then as needed   [DISCONTINUED] metFORMIN (GLUCOPHAGE-XR) 500 MG 24 hr tablet TAKE 1 TABLET BY MOUTH EVERY DAY WITH SUPPER   [DISCONTINUED] Tdap (BOOSTRIX) 5-2.5-18.5 LF-MCG/0.5 injection Inject 0.5 mLs into the muscle once.   glimepiride (AMARYL) 2 MG tablet TAKE 1 TABLET(2 MG) BY MOUTH DAILY WITH BREAKFAST   metFORMIN (GLUCOPHAGE-XR) 500 MG 24 hr tablet TAKE 1 TABLET BY MOUTH EVERY DAY WITH SUPPER   Tdap (BOOSTRIX) 5-2.5-18.5 LF-MCG/0.5 injection Inject 0.5 mLs into the muscle once for 1 dose.   No facility-administered encounter medications on file as of 11/28/2022.    Surgical History: Past Surgical History:  Procedure Laterality Date   CESAREAN SECTION     GALLBLADDER SURGERY   2011    Medical History: Past Medical History:  Diagnosis Date   Diabetes mellitus without complication (HCC)     Family History: Family History  Problem Relation Age of Onset   Diabetes Mother    Diabetes Maternal Grandmother     Social History   Socioeconomic History   Marital status: Married    Spouse name: Not on file   Number of children: Not on file   Years of education: Not on file   Highest education level: Not on file  Occupational History   Not on file  Tobacco Use   Smoking status: Never   Smokeless tobacco: Never  Vaping Use   Vaping Use: Not on file  Substance and Sexual Activity   Alcohol use: Never   Drug use: Never   Sexual activity: Yes    Birth control/protection: Surgical    Comment: BTL  Other Topics Concern   Not on file  Social History Narrative   Not on file   Social Determinants of Health   Financial Resource Strain: Not on file  Food Insecurity: Not on file  Transportation Needs: Not on file  Physical Activity: Not on file  Stress: Not on file  Social Connections: Not on file  Intimate Partner Violence: Not on file      Review of Systems  Constitutional:  Negative for chills, fatigue and unexpected weight change.  HENT:  Negative for congestion, postnasal drip, rhinorrhea, sneezing, sore throat and voice change.   Eyes:  Negative for redness.  Respiratory:  Negative for  cough, chest tightness and shortness of breath.   Cardiovascular:  Negative for chest pain and palpitations.  Gastrointestinal:  Negative for abdominal pain, constipation, diarrhea, nausea and vomiting.  Genitourinary:  Negative for dysuria and frequency.  Musculoskeletal:  Negative for arthralgias, back pain, joint swelling and neck pain.  Skin:  Negative for rash.  Neurological:  Positive for numbness. Negative for dizziness, tremors, weakness and light-headedness.  Hematological:  Negative for adenopathy. Does not bruise/bleed easily.   Psychiatric/Behavioral:  Negative for behavioral problems (Depression), sleep disturbance and suicidal ideas. The patient is not nervous/anxious.     Vital Signs: BP 136/82   Pulse 100   Temp 98.1 F (36.7 C)   Resp 16   Ht 5\' 1"  (1.549 m)   Wt 156 lb 9.6 oz (71 kg)   SpO2 99%   BMI 29.59 kg/m    Physical Exam Vitals reviewed.  Constitutional:      Appearance: Normal appearance.  HENT:     Head: Normocephalic and atraumatic.     Nose: Nose normal.     Mouth/Throat:     Mouth: Mucous membranes are moist.     Pharynx: No posterior oropharyngeal erythema.  Eyes:     Extraocular Movements: Extraocular movements intact.     Pupils: Pupils are equal, round, and reactive to light.  Cardiovascular:     Pulses: Normal pulses.     Heart sounds: Normal heart sounds.  Pulmonary:     Effort: Pulmonary effort is normal.     Breath sounds: Normal breath sounds.  Neurological:     General: No focal deficit present.     Mental Status: She is alert and oriented to person, place, and time.  Psychiatric:        Mood and Affect: Mood normal.        Behavior: Behavior normal.        Assessment/Plan: 1. Poorly controlled diabetes mellitus (HCC) A1c improving, continue metformin and glimepiride as prescribed.  - POCT glycosylated hemoglobin (Hb A1C) - Urine Microalbumin w/creat. ratio - metFORMIN (GLUCOPHAGE-XR) 500 MG 24 hr tablet; TAKE 1 TABLET BY MOUTH EVERY DAY WITH SUPPER  Dispense: 90 tablet; Refill: 1 - glimepiride (AMARYL) 2 MG tablet; TAKE 1 TABLET(2 MG) BY MOUTH DAILY WITH BREAKFAST  Dispense: 90 tablet; Refill: 1  2. Neuropathy associated with endocrine disorder (HCC) Try gabapentin, start with just taking it at night, add the morning and/or midday dose if needed and tolerated.  - gabapentin (NEURONTIN) 100 MG capsule; Take 1 capsule (100 mg total) by mouth 3 (three) times daily.  Dispense: 90 capsule; Refill: 5  3. Back pain of lumbar region with sciatica Resolved,  discontinue meloxicam and cyclobenzaprine   4. Need for vaccination - Tdap (BOOSTRIX) 5-2.5-18.5 LF-MCG/0.5 injection; Inject 0.5 mLs into the muscle once for 1 dose.  Dispense: 0.5 mL; Refill: 0   General Counseling: merriah ankrum understanding of the findings of todays visit and agrees with plan of treatment. I have discussed any further diagnostic evaluation that may be needed or ordered today. We also reviewed her medications today. she has been encouraged to call the office with any questions or concerns that should arise related to todays visit.    Orders Placed This Encounter  Procedures   Urine Microalbumin w/creat. ratio   POCT glycosylated hemoglobin (Hb A1C)    Meds ordered this encounter  Medications   Tdap (BOOSTRIX) 5-2.5-18.5 LF-MCG/0.5 injection    Sig: Inject 0.5 mLs into the muscle once for 1 dose.  Dispense:  0.5 mL    Refill:  0   gabapentin (NEURONTIN) 100 MG capsule    Sig: Take 1 capsule (100 mg total) by mouth 3 (three) times daily.    Dispense:  90 capsule    Refill:  5   metFORMIN (GLUCOPHAGE-XR) 500 MG 24 hr tablet    Sig: TAKE 1 TABLET BY MOUTH EVERY DAY WITH SUPPER    Dispense:  90 tablet    Refill:  1   glimepiride (AMARYL) 2 MG tablet    Sig: TAKE 1 TABLET(2 MG) BY MOUTH DAILY WITH BREAKFAST    Dispense:  90 tablet    Refill:  1    Return in about 4 months (around 03/30/2023) for F/U, Recheck A1C, Breeze Angell PCP.   Total time spent:30 Minutes Time spent includes review of chart, medications, test results, and follow up plan with the patient.   [place] Controlled Substance Database was reviewed by me.  This patient was seen by Sallyanne Kuster, FNP-C in collaboration with Dr. Beverely Risen as a part of collaborative care agreement.   Erial Fikes R. Tedd Sias, MSN, FNP-C Internal medicine

## 2022-11-29 LAB — MICROALBUMIN / CREATININE URINE RATIO
Creatinine, Urine: 110.2 mg/dL
Microalb/Creat Ratio: 6 mg/g creat (ref 0–29)
Microalbumin, Urine: 6.6 ug/mL

## 2022-12-07 ENCOUNTER — Telehealth: Payer: Self-pay | Admitting: Nurse Practitioner

## 2022-12-07 NOTE — Telephone Encounter (Signed)
Faxed; 04-07-22 office note to [employer] [phone]

## 2023-02-01 ENCOUNTER — Encounter: Payer: BLUE CROSS/BLUE SHIELD | Admitting: Nurse Practitioner

## 2023-02-15 ENCOUNTER — Encounter: Payer: Self-pay | Admitting: Nurse Practitioner

## 2023-02-15 ENCOUNTER — Ambulatory Visit (INDEPENDENT_AMBULATORY_CARE_PROVIDER_SITE_OTHER): Payer: BLUE CROSS/BLUE SHIELD | Admitting: Nurse Practitioner

## 2023-02-15 VITALS — BP 132/86 | HR 98 | Temp 98.2°F | Resp 16 | Ht 61.0 in | Wt 160.0 lb

## 2023-02-15 DIAGNOSIS — Z0001 Encounter for general adult medical examination with abnormal findings: Secondary | ICD-10-CM

## 2023-02-15 DIAGNOSIS — E559 Vitamin D deficiency, unspecified: Secondary | ICD-10-CM

## 2023-02-15 DIAGNOSIS — E538 Deficiency of other specified B group vitamins: Secondary | ICD-10-CM

## 2023-02-15 DIAGNOSIS — E782 Mixed hyperlipidemia: Secondary | ICD-10-CM | POA: Diagnosis not present

## 2023-02-15 DIAGNOSIS — M5440 Lumbago with sciatica, unspecified side: Secondary | ICD-10-CM

## 2023-02-15 DIAGNOSIS — E1165 Type 2 diabetes mellitus with hyperglycemia: Secondary | ICD-10-CM | POA: Diagnosis not present

## 2023-02-15 DIAGNOSIS — Z1231 Encounter for screening mammogram for malignant neoplasm of breast: Secondary | ICD-10-CM

## 2023-02-15 DIAGNOSIS — E1169 Type 2 diabetes mellitus with other specified complication: Secondary | ICD-10-CM | POA: Insufficient documentation

## 2023-02-15 NOTE — Progress Notes (Signed)
[facility] [address] [place], [place] [address]  Internal MEDICINE  Office Visit Note  Patient Name: Grace Pena  [id number]  [id number]  Date of Service: 02/15/2023  Chief Complaint  Patient presents with   Diabetes   Annual Exam    HPI Grace Pena presents for an annual well visit and physical exam.  Well-appearing 52 y.o. female with diabetes, hypertension, high cholesterol and back pain.  Routine CRC screening: did cologuard last year  Routine mammogram: due now Pap smear: due now Labs: due for routine labs, did not get labs drawn last year.  New or worsening pain: low back pain intermittently      Current Medication: Outpatient Encounter Medications as of 02/15/2023  Medication Sig   gabapentin (NEURONTIN) 100 MG capsule Take 1 capsule (100 mg total) by mouth 3 (three) times daily.   glimepiride (AMARYL) 2 MG tablet TAKE 1 TABLET(2 MG) BY MOUTH DAILY WITH BREAKFAST   metFORMIN (GLUCOPHAGE-XR) 500 MG 24 hr tablet TAKE 1 TABLET BY MOUTH EVERY DAY WITH SUPPER   VOLNEA 0.15-0.02/0.01 MG (21/5) tablet TAKE 1 TABLET BY MOUTH AT BEDTIME   No facility-administered encounter medications on file as of 02/15/2023.    Surgical History: Past Surgical History:  Procedure Laterality Date   CESAREAN SECTION     GALLBLADDER SURGERY  2011    Medical History: Past Medical History:  Diagnosis Date   Diabetes mellitus without complication (HCC)     Family History: Family History  Problem Relation Age of Onset   Diabetes Mother    Diabetes Maternal Grandmother     Social History   Socioeconomic History   Marital status: Married    Spouse name: Not on file   Number of children: Not on file   Years of education: Not on file   Highest education level: Not on file  Occupational History   Not on file  Tobacco Use   Smoking status: Never   Smokeless tobacco: Never  Vaping Use   Vaping status: Not on file  Substance and Sexual Activity   Alcohol use:  Never   Drug use: Never   Sexual activity: Yes    Birth control/protection: Surgical    Comment: BTL  Other Topics Concern   Not on file  Social History Narrative   Not on file   Social Determinants of Health   Financial Resource Strain: Not on file  Food Insecurity: Not on file  Transportation Needs: Not on file  Physical Activity: Not on file  Stress: Not on file  Social Connections: Not on file  Intimate Partner Violence: Not on file      Review of Systems  Constitutional:  Negative for activity change, appetite change, chills, fatigue, fever and unexpected weight change.  HENT: Negative.  Negative for congestion, ear pain, rhinorrhea, sore throat and trouble swallowing.   Eyes: Negative.   Respiratory: Negative.  Negative for cough, chest tightness, shortness of breath and wheezing.   Cardiovascular: Negative.  Negative for chest pain.  Gastrointestinal: Negative.  Negative for abdominal pain, blood in stool, constipation, diarrhea, nausea and vomiting.  Endocrine: Negative.   Genitourinary: Negative.  Negative for difficulty urinating, dysuria, frequency, hematuria and urgency.  Musculoskeletal: Negative.  Negative for arthralgias, back pain, joint swelling, myalgias and neck pain.  Skin: Negative.  Negative for rash and wound.  Allergic/Immunologic: Negative.  Negative for immunocompromised state.  Neurological: Negative.  Negative for dizziness, seizures, numbness and headaches.  Hematological: Negative.   Psychiatric/Behavioral: Negative.  Negative for behavioral problems, self-injury and suicidal ideas. The patient is not nervous/anxious.     Vital Signs: BP 132/86   Pulse 98   Temp 98.2 F (36.8 C)   Resp 16   Ht 5\' 1"  (1.549 m)   Wt 160 lb (72.6 kg)   SpO2 98%   BMI 30.23 kg/m    Physical Exam Vitals reviewed.  Constitutional:      General: She is awake. She is not in acute distress.    Appearance: Normal appearance. She is well-developed and  well-groomed. She is obese. She is not ill-appearing or diaphoretic.  HENT:     Head: Normocephalic and atraumatic.     Right Ear: Tympanic membrane, ear canal and external ear normal.     Left Ear: Tympanic membrane, ear canal and external ear normal.     Nose: Nose normal. No congestion or rhinorrhea.     Mouth/Throat:     Mouth: Mucous membranes are moist.     Pharynx: Oropharynx is clear. No oropharyngeal exudate or posterior oropharyngeal erythema.  Eyes:     General: Lids are normal. Vision grossly intact. Gaze aligned appropriately. No scleral icterus.       Right eye: No discharge.        Left eye: No discharge.     Extraocular Movements: Extraocular movements intact.     Conjunctiva/sclera: Conjunctivae normal.     Pupils: Pupils are equal, round, and reactive to light.  Neck:     Thyroid: No thyromegaly.     Vascular: No JVD.     Trachea: No tracheal deviation.  Cardiovascular:     Rate and Rhythm: Normal rate and regular rhythm.     Pulses: Normal pulses.     Heart sounds: Normal heart sounds. No murmur heard.    No friction rub. No gallop.  Pulmonary:     Effort: Pulmonary effort is normal. No respiratory distress.     Breath sounds: Normal breath sounds. No stridor. No wheezing or rales.  Chest:     Chest wall: No tenderness.  Abdominal:     General: Bowel sounds are normal. There is no distension.     Palpations: Abdomen is soft. There is no mass.     Tenderness: There is no abdominal tenderness. There is no guarding or rebound.  Musculoskeletal:        General: No tenderness or deformity. Normal range of motion.     Cervical back: Normal range of motion and neck supple.  Lymphadenopathy:     Cervical: No cervical adenopathy.  Skin:    General: Skin is warm and dry.     Capillary Refill: Capillary refill takes less than 2 seconds.     Coloration: Skin is not pale.     Findings: No erythema or rash.  Neurological:     Mental Status: She is alert and oriented  to person, place, and time.     Cranial Nerves: No cranial nerve deficit.     Motor: No abnormal muscle tone.     Coordination: Coordination normal.     Gait: Gait normal.     Deep Tendon Reflexes: Reflexes are normal and symmetric.  Psychiatric:        Mood and Affect: Mood normal.        Behavior: Behavior normal. Behavior is cooperative.        Thought Content: Thought content normal.        Judgment: Judgment normal.  Assessment/Plan: 1. Encounter for routine adult health examination with abnormal findings Age-appropriate preventive screenings and vaccinations discussed, annual physical exam completed. Routine labs for health maintenance ordered, see below. PHM updated.  - CBC with Differential/Platelet - CMP14+EGFR - Lipid Profile - Hgb A1C w/o eAG - TSH + free T4 - B12 and Folate Panel - Vitamin D (25 hydroxy)  2. Uncontrolled type 2 diabetes mellitus with hyperglycemia (HCC) Routine labs ordered  - CBC with Differential/Platelet - CMP14+EGFR - Lipid Profile - Hgb A1C w/o eAG - TSH + free T4 - B12 and Folate Panel - Vitamin D (25 hydroxy)  3. Back pain of lumbar region with sciatica Off and on, can take cyclobenzaprine as needed.   4. Mixed hyperlipidemia Routine labs ordered  - CBC with Differential/Platelet - CMP14+EGFR - Lipid Profile - Hgb A1C w/o eAG - TSH + free T4 - B12 and Folate Panel - Vitamin D (25 hydroxy)  5. B12 deficiency Routine labs ordered  - CBC with Differential/Platelet - CMP14+EGFR - Lipid Profile - Hgb A1C w/o eAG - TSH + free T4 - B12 and Folate Panel - Vitamin D (25 hydroxy)  6. Vitamin D deficiency Routine labs ordered  - CBC with Differential/Platelet - CMP14+EGFR - Lipid Profile - Hgb A1C w/o eAG - TSH + free T4 - B12 and Folate Panel - Vitamin D (25 hydroxy)  7. Encounter for screening mammogram for malignant neoplasm of breast Routine mammogram ordered  - MM 3D SCREENING MAMMOGRAM BILATERAL BREAST;  Future     General Counseling: dorice fleischmann understanding of the findings of todays visit and agrees with plan of treatment. I have discussed any further diagnostic evaluation that may be needed or ordered today. We also reviewed her medications today. she has been encouraged to call the office with any questions or concerns that should arise related to todays visit.    Orders Placed This Encounter  Procedures   MM 3D SCREENING MAMMOGRAM BILATERAL BREAST   CBC with Differential/Platelet   CMP14+EGFR   Lipid Profile   Hgb A1C w/o eAG   TSH + free T4   B12 and Folate Panel   Vitamin D (25 hydroxy)    No orders of the defined types were placed in this encounter.   Return in about 1 month (around 03/17/2023) for F/U, PAP ONLY, Aanshi Batchelder PCP.   Total time spent:30 Minutes Time spent includes review of chart, medications, test results, and follow up plan with the patient.   [place] Controlled Substance Database was reviewed by me.  This patient was seen by Sallyanne Kuster, FNP-C in collaboration with Dr. Beverely Risen as a part of collaborative care agreement.  Sagan Maselli R. Tedd Sias, MSN, FNP-C Internal medicine

## 2023-02-18 LAB — CBC WITH DIFFERENTIAL/PLATELET
Basophils Absolute: 0.1 10*3/uL (ref 0.0–0.2)
Basos: 1 %
EOS (ABSOLUTE): 0.2 10*3/uL (ref 0.0–0.4)
Eos: 3 %
Hematocrit: 41.5 % (ref 34.0–46.6)
Hemoglobin: 13.1 g/dL (ref 11.1–15.9)
Immature Grans (Abs): 0 10*3/uL (ref 0.0–0.1)
Immature Granulocytes: 0 %
Lymphocytes Absolute: 2.3 10*3/uL (ref 0.7–3.1)
Lymphs: 31 %
MCH: 27.5 pg (ref 26.6–33.0)
MCHC: 31.6 g/dL (ref 31.5–35.7)
MCV: 87 fL (ref 79–97)
Monocytes Absolute: 0.5 10*3/uL (ref 0.1–0.9)
Monocytes: 7 %
Neutrophils Absolute: 4.3 10*3/uL (ref 1.4–7.0)
Neutrophils: 58 %
Platelets: 360 10*3/uL (ref 150–450)
RBC: 4.77 x10E6/uL (ref 3.77–5.28)
RDW: 13.3 % (ref 11.7–15.4)
WBC: 7.3 10*3/uL (ref 3.4–10.8)

## 2023-02-18 LAB — CMP14+EGFR
ALT: 15 IU/L (ref 0–32)
AST: 11 IU/L (ref 0–40)
Albumin: 4.4 g/dL (ref 3.9–4.9)
Alkaline Phosphatase: 97 IU/L (ref 44–121)
BUN/Creatinine Ratio: 18 (ref 9–23)
BUN: 13 mg/dL (ref 6–24)
Bilirubin Total: 0.3 mg/dL (ref 0.0–1.2)
CO2: 25 mmol/L (ref 20–29)
Calcium: 9.6 mg/dL (ref 8.7–10.2)
Chloride: 102 mmol/L (ref 96–106)
Creatinine, Ser: 0.71 mg/dL (ref 0.57–1.00)
Globulin, Total: 2.6 g/dL (ref 1.5–4.5)
Glucose: 184 mg/dL — ABNORMAL HIGH (ref 70–99)
Potassium: 4.4 mmol/L (ref 3.5–5.2)
Sodium: 139 mmol/L (ref 134–144)
Total Protein: 7 g/dL (ref 6.0–8.5)
eGFR: 104 mL/min/{1.73_m2} (ref >59)

## 2023-02-18 LAB — VITAMIN D 25 HYDROXY (VIT D DEFICIENCY, FRACTURES): Vit D, 25-Hydroxy: 16.3 ng/mL — ABNORMAL LOW (ref 30.0–100.0)

## 2023-02-18 LAB — LIPID PANEL
Chol/HDL Ratio: 6.1 ratio — ABNORMAL HIGH (ref 0.0–4.4)
Cholesterol, Total: 269 mg/dL — ABNORMAL HIGH (ref 100–199)
HDL: 44 mg/dL (ref >39)
LDL Chol Calc (NIH): 194 mg/dL — ABNORMAL HIGH (ref 0–99)
Triglycerides: 166 mg/dL — ABNORMAL HIGH (ref 0–149)
VLDL Cholesterol Cal: 31 mg/dL (ref 5–40)

## 2023-02-18 LAB — TSH+FREE T4
Free T4: 1.18 ng/dL (ref 0.82–1.77)
TSH: 4.53 u[IU]/mL — ABNORMAL HIGH (ref 0.450–4.500)

## 2023-02-18 LAB — B12 AND FOLATE PANEL
Folate: >20 ng/mL (ref >3.0)
Vitamin B-12: 940 pg/mL (ref 232–1245)

## 2023-02-18 LAB — HGB A1C W/O EAG: Hgb A1c MFr Bld: 8.1 % — ABNORMAL HIGH (ref 4.8–5.6)

## 2023-03-21 ENCOUNTER — Encounter: Payer: Self-pay | Admitting: Nurse Practitioner

## 2023-03-21 ENCOUNTER — Ambulatory Visit (INDEPENDENT_AMBULATORY_CARE_PROVIDER_SITE_OTHER): Payer: BLUE CROSS/BLUE SHIELD | Admitting: Nurse Practitioner

## 2023-03-21 VITALS — BP 130/88 | HR 89 | Temp 97.2°F | Resp 16 | Ht 61.0 in | Wt 159.4 lb

## 2023-03-21 DIAGNOSIS — R7989 Other specified abnormal findings of blood chemistry: Secondary | ICD-10-CM | POA: Diagnosis not present

## 2023-03-21 DIAGNOSIS — Z124 Encounter for screening for malignant neoplasm of cervix: Secondary | ICD-10-CM | POA: Diagnosis not present

## 2023-03-21 DIAGNOSIS — E559 Vitamin D deficiency, unspecified: Secondary | ICD-10-CM | POA: Diagnosis not present

## 2023-03-21 DIAGNOSIS — E1169 Type 2 diabetes mellitus with other specified complication: Secondary | ICD-10-CM

## 2023-03-21 DIAGNOSIS — E785 Hyperlipidemia, unspecified: Secondary | ICD-10-CM

## 2023-03-21 DIAGNOSIS — Z113 Encounter for screening for infections with a predominantly sexual mode of transmission: Secondary | ICD-10-CM

## 2023-03-21 MED ORDER — ROSUVASTATIN CALCIUM 5 MG PO TABS
5.0000 mg | ORAL_TABLET | Freq: Every day | ORAL | 2 refills | Status: DC
Start: 2023-03-21 — End: 2023-06-26

## 2023-03-21 MED ORDER — METFORMIN HCL ER 750 MG PO TB24: 750.0000 mg | ORAL_TABLET | Freq: Every day | ORAL | 1 refills | Status: DC

## 2023-03-21 MED ORDER — VITAMIN D (ERGOCALCIFEROL) 1.25 MG (50000 UNIT) PO CAPS
50000.0000 [IU] | ORAL_CAPSULE | ORAL | 1 refills | Status: DC
Start: 2023-03-21 — End: 2023-09-08

## 2023-03-21 NOTE — Progress Notes (Signed)
[facility] [address] [place], [place] [address]  Internal MEDICINE  Office Visit Note  Patient Name: Grace Pena  [id number]  [id number]  Date of Service: 03/21/2023  Chief Complaint  Patient presents with   Follow-up    HPI Grace Pena presents for a follow-up visit for pap smear and lab results.  Vitamin D low 16.3  High cholesterol -- LDL 194  Diabetes -- A1c high at 8.1, currently taking metformin XR 500 mg daily. TSH elevated 4.530 Pap smear done with pelvic exam.     Current Medication: Outpatient Encounter Medications as of 03/21/2023  Medication Sig   gabapentin (NEURONTIN) 100 MG capsule Take 1 capsule (100 mg total) by mouth 3 (three) times daily.   glimepiride (AMARYL) 2 MG tablet TAKE 1 TABLET(2 MG) BY MOUTH DAILY WITH BREAKFAST   metFORMIN (GLUCOPHAGE-XR) 750 MG 24 hr tablet Take 1 tablet (750 mg total) by mouth daily with breakfast.   rosuvastatin (CRESTOR) 5 MG tablet Take 1 tablet (5 mg total) by mouth daily.   Vitamin D, Ergocalciferol, (DRISDOL) 1.25 MG (50000 UNIT) CAPS capsule Take 1 capsule (50,000 Units total) by mouth every 7 (seven) days.   VOLNEA 0.15-0.02/0.01 MG (21/5) tablet TAKE 1 TABLET BY MOUTH AT BEDTIME   [DISCONTINUED] metFORMIN (GLUCOPHAGE-XR) 500 MG 24 hr tablet TAKE 1 TABLET BY MOUTH EVERY DAY WITH SUPPER   No facility-administered encounter medications on file as of 03/21/2023.    Surgical History: Past Surgical History:  Procedure Laterality Date   CESAREAN SECTION     GALLBLADDER SURGERY  2011    Medical History: Past Medical History:  Diagnosis Date   Diabetes mellitus without complication (HCC)     Family History: Family History  Problem Relation Age of Onset   Diabetes Mother    Diabetes Maternal Grandmother     Social History   Socioeconomic History   Marital status: Married    Spouse name: Not on file   Number of children: Not on file   Years of education: Not on file   Highest education  level: Not on file  Occupational History   Not on file  Tobacco Use   Smoking status: Never   Smokeless tobacco: Never  Vaping Use   Vaping status: Not on file  Substance and Sexual Activity   Alcohol use: Never   Drug use: Never   Sexual activity: Yes    Birth control/protection: Surgical    Comment: BTL  Other Topics Concern   Not on file  Social History Narrative   Not on file   Social Determinants of Health   Financial Resource Strain: Not on file  Food Insecurity: Not on file  Transportation Needs: Not on file  Physical Activity: Not on file  Stress: Not on file  Social Connections: Not on file  Intimate Partner Violence: Not on file      Review of Systems  Constitutional:  Positive for fatigue. Negative for chills and unexpected weight change.  HENT:  Negative for congestion, postnasal drip, rhinorrhea, sneezing and sore throat.   Eyes:  Negative for redness.  Respiratory:  Negative for cough, chest tightness and shortness of breath.   Cardiovascular:  Negative for chest pain and palpitations.  Gastrointestinal:  Negative for abdominal pain, constipation, diarrhea, nausea and vomiting.  Genitourinary:  Negative for dysuria and frequency.  Musculoskeletal:  Negative for arthralgias, back pain, joint swelling and neck pain.  Skin:  Negative for rash.  Neurological: Negative.  Negative for tremors and  numbness.  Hematological:  Negative for adenopathy. Does not bruise/bleed easily.  Psychiatric/Behavioral:  Negative for behavioral problems (Depression), sleep disturbance and suicidal ideas. The patient is not nervous/anxious.     Vital Signs: BP 130/88   Pulse 89   Temp (!) 97.2 F (36.2 C)   Resp 16   Ht 5\' 1"  (1.549 m)   Wt 159 lb 6.4 oz (72.3 kg)   SpO2 96%   BMI 30.12 kg/m    Physical Exam Vitals reviewed.  Constitutional:      Appearance: Normal appearance.  HENT:     Head: Normocephalic and atraumatic.     Nose: Nose normal.      Mouth/Throat:     Mouth: Mucous membranes are moist.     Pharynx: No posterior oropharyngeal erythema.  Eyes:     Extraocular Movements: Extraocular movements intact.     Pupils: Pupils are equal, round, and reactive to light.  Cardiovascular:     Pulses: Normal pulses.     Heart sounds: Normal heart sounds.  Pulmonary:     Effort: Pulmonary effort is normal.     Breath sounds: Normal breath sounds.  Neurological:     General: No focal deficit present.     Mental Status: She is alert and oriented to person, place, and time.  Psychiatric:        Mood and Affect: Mood normal.        Behavior: Behavior normal.        Assessment/Plan: 1. Type 2 diabetes mellitus with other specified complication, without long-term current use of insulin (HCC) Metformin XR dose increased to 750 mg daily, take as prescribed  - metFORMIN (GLUCOPHAGE-XR) 750 MG 24 hr tablet; Take 1 tablet (750 mg total) by mouth daily with breakfast.  Dispense: 90 tablet; Refill: 1  2. Hyperlipidemia associated with type 2 diabetes mellitus (HCC) Start rosuvastatin as prescribed.  - rosuvastatin (CRESTOR) 5 MG tablet; Take 1 tablet (5 mg total) by mouth daily.  Dispense: 30 tablet; Refill: 2  3. Vitamin D deficiency Start vitamin D weekly supplement as prescribed.  - Vitamin D, Ergocalciferol, (DRISDOL) 1.25 MG (50000 UNIT) CAPS capsule; Take 1 capsule (50,000 Units total) by mouth every 7 (seven) days.  Dispense: 12 capsule; Refill: 1  4. Elevated TSH No changes, will repeat level in a few months to see if the elevation resolves.   5. Routine cervical smear Normal Pelvic exam with pap smear - IGP, Aptima HPV  6. Screening for STDs (sexually transmitted diseases) Cervical swab specimen obtained - NuSwab Vaginitis Plus (VG+)   General Counseling: Grace Pena understanding of the findings of todays visit and agrees with plan of treatment. I have discussed any further diagnostic evaluation that may be  needed or ordered today. We also reviewed her medications today. she has been encouraged to call the office with any questions or concerns that should arise related to todays visit.    Orders Placed This Encounter  Procedures   NuSwab Vaginitis Plus (VG+)    Meds ordered this encounter  Medications   Vitamin D, Ergocalciferol, (DRISDOL) 1.25 MG (50000 UNIT) CAPS capsule    Sig: Take 1 capsule (50,000 Units total) by mouth every 7 (seven) days.    Dispense:  12 capsule    Refill:  1   rosuvastatin (CRESTOR) 5 MG tablet    Sig: Take 1 tablet (5 mg total) by mouth daily.    Dispense:  30 tablet    Refill:  2  metFORMIN (GLUCOPHAGE-XR) 750 MG 24 hr tablet    Sig: Take 1 tablet (750 mg total) by mouth daily with breakfast.    Dispense:  90 tablet    Refill:  1    Return in about 4 months (around 07/22/2023) for F/U, Recheck A1C, Sharol Croghan PCP.   Total time spent:30 Minutes Time spent includes review of chart, medications, test results, and follow up plan with the patient.   [place] Controlled Substance Database was reviewed by me.  This patient was seen by Sallyanne Kuster, FNP-C in collaboration with Dr. Beverely Risen as a part of collaborative care agreement.   Jocelyn Lowery R. Tedd Sias, MSN, FNP-C Internal medicine

## 2023-03-23 LAB — NUSWAB VAGINITIS PLUS (VG+)
Atopobium vaginae: HIGH {score} — AB
Candida albicans, NAA: NEGATIVE
Candida glabrata, NAA: NEGATIVE
Chlamydia trachomatis, NAA: NEGATIVE
Neisseria gonorrhoeae, NAA: NEGATIVE
Trich vag by NAA: NEGATIVE

## 2023-03-24 LAB — IGP, APTIMA HPV: HPV Aptima: NEGATIVE

## 2023-03-30 ENCOUNTER — Ambulatory Visit: Payer: BLUE CROSS/BLUE SHIELD | Admitting: Nurse Practitioner

## 2023-05-06 ENCOUNTER — Encounter: Payer: Self-pay | Admitting: Nurse Practitioner

## 2023-05-06 DIAGNOSIS — E559 Vitamin D deficiency, unspecified: Secondary | ICD-10-CM | POA: Insufficient documentation

## 2023-05-06 DIAGNOSIS — E119 Type 2 diabetes mellitus without complications: Secondary | ICD-10-CM | POA: Insufficient documentation

## 2023-05-25 ENCOUNTER — Other Ambulatory Visit: Payer: Self-pay | Admitting: Nurse Practitioner

## 2023-05-25 DIAGNOSIS — E1165 Type 2 diabetes mellitus with hyperglycemia: Secondary | ICD-10-CM

## 2023-06-26 ENCOUNTER — Other Ambulatory Visit: Payer: Self-pay | Admitting: Nurse Practitioner

## 2023-06-26 DIAGNOSIS — E1169 Type 2 diabetes mellitus with other specified complication: Secondary | ICD-10-CM

## 2023-09-08 ENCOUNTER — Other Ambulatory Visit: Payer: Self-pay | Admitting: Nurse Practitioner

## 2023-09-08 DIAGNOSIS — E559 Vitamin D deficiency, unspecified: Secondary | ICD-10-CM

## 2023-09-14 ENCOUNTER — Other Ambulatory Visit: Payer: Self-pay | Admitting: Nurse Practitioner

## 2023-09-14 DIAGNOSIS — E1169 Type 2 diabetes mellitus with other specified complication: Secondary | ICD-10-CM

## 2023-09-18 ENCOUNTER — Other Ambulatory Visit: Payer: Self-pay | Admitting: Nurse Practitioner

## 2023-09-18 DIAGNOSIS — E1169 Type 2 diabetes mellitus with other specified complication: Secondary | ICD-10-CM

## 2023-11-17 ENCOUNTER — Other Ambulatory Visit: Payer: Self-pay | Admitting: Nurse Practitioner

## 2023-11-17 DIAGNOSIS — E1165 Type 2 diabetes mellitus with hyperglycemia: Secondary | ICD-10-CM

## 2023-12-14 ENCOUNTER — Other Ambulatory Visit: Payer: Self-pay | Admitting: Nurse Practitioner

## 2023-12-14 DIAGNOSIS — E1169 Type 2 diabetes mellitus with other specified complication: Secondary | ICD-10-CM

## 2024-02-19 ENCOUNTER — Encounter: Payer: Self-pay | Admitting: Nurse Practitioner

## 2024-02-19 ENCOUNTER — Ambulatory Visit (INDEPENDENT_AMBULATORY_CARE_PROVIDER_SITE_OTHER): Payer: BLUE CROSS/BLUE SHIELD | Admitting: Nurse Practitioner

## 2024-02-19 VITALS — BP 136/88 | HR 84 | Temp 96.9°F | Resp 16 | Ht 61.0 in | Wt 158.4 lb

## 2024-02-19 DIAGNOSIS — Z1231 Encounter for screening mammogram for malignant neoplasm of breast: Secondary | ICD-10-CM

## 2024-02-19 DIAGNOSIS — E785 Hyperlipidemia, unspecified: Secondary | ICD-10-CM

## 2024-02-19 DIAGNOSIS — E1169 Type 2 diabetes mellitus with other specified complication: Secondary | ICD-10-CM

## 2024-02-19 DIAGNOSIS — Z0001 Encounter for general adult medical examination with abnormal findings: Secondary | ICD-10-CM

## 2024-02-19 DIAGNOSIS — R7989 Other specified abnormal findings of blood chemistry: Secondary | ICD-10-CM | POA: Diagnosis not present

## 2024-02-19 DIAGNOSIS — E349 Endocrine disorder, unspecified: Secondary | ICD-10-CM

## 2024-02-19 DIAGNOSIS — E538 Deficiency of other specified B group vitamins: Secondary | ICD-10-CM

## 2024-02-19 DIAGNOSIS — G63 Polyneuropathy in diseases classified elsewhere: Secondary | ICD-10-CM

## 2024-02-19 DIAGNOSIS — E559 Vitamin D deficiency, unspecified: Secondary | ICD-10-CM

## 2024-02-19 LAB — POCT GLYCOSYLATED HEMOGLOBIN (HGB A1C): Hemoglobin A1C: 9.4 % — AB (ref 4.0–5.6)

## 2024-02-19 MED ORDER — ROSUVASTATIN CALCIUM 5 MG PO TABS: 5.0000 mg | ORAL_TABLET | Freq: Every day | ORAL | 3 refills | Status: AC

## 2024-02-19 MED ORDER — GABAPENTIN 100 MG PO CAPS
100.0000 mg | ORAL_CAPSULE | Freq: Three times a day (TID) | ORAL | 11 refills | Status: AC
Start: 2024-02-19 — End: ?

## 2024-02-19 MED ORDER — GLIMEPIRIDE 2 MG PO TABS: ORAL_TABLET | ORAL | 1 refills | Status: AC

## 2024-02-19 MED ORDER — CYCLOBENZAPRINE HCL 5 MG PO TABS: 5.0000 mg | ORAL_TABLET | Freq: Three times a day (TID) | ORAL | 5 refills | Status: AC | PRN

## 2024-02-19 MED ORDER — METFORMIN HCL ER 750 MG PO TB24: 750.0000 mg | ORAL_TABLET | Freq: Every day | ORAL | 3 refills | Status: AC

## 2024-02-19 NOTE — Progress Notes (Signed)
 [facility] [address] [place], [place] [address]  Internal MEDICINE  Office Visit Note  Patient Name: Grace Pena  [id number]  [id number]  Date of Service: 02/19/2024  Chief Complaint  Patient presents with   Diabetes   Annual Exam    HPI Graviela presents for an annual well visit and physical exam.  Well-appearing 53 y.o. female with diabetes, hypertension, high cholesterol and chronic low back pain, and diabetic neuropathy  Routine CRC screening: due next year for cologuard Routine mammogram: overdue now  DEXA scan: not due yet Pap smear:due in 2029 Eye exam: due for annual diabetic eye exam now  foot exam: done today.  Labs: due for routine labs  New or worsening pain: low back pain, sciatica and neuropathy in both feet Other concerns: none    Current Medication: Outpatient Encounter Medications as of 02/19/2024  Medication Sig   cyclobenzaprine  (FLEXERIL ) 5 MG tablet Take 1 tablet (5 mg total) by mouth 3 (three) times daily as needed for muscle spasms.   gabapentin  (NEURONTIN ) 100 MG capsule Take 1 capsule (100 mg total) by mouth 3 (three) times daily.   glimepiride  (AMARYL ) 2 MG tablet TAKE 1 TABLET(2 MG) BY MOUTH DAILY WITH BREAKFAST   metFORMIN  (GLUCOPHAGE -XR) 750 MG 24 hr tablet Take 1 tablet (750 mg total) by mouth daily with breakfast.   rosuvastatin  (CRESTOR ) 5 MG tablet Take 1 tablet (5 mg total) by mouth daily.   Vitamin D , Ergocalciferol , (DRISDOL ) 1.25 MG (50000 UNIT) CAPS capsule TAKE 1 CAPSULE BY MOUTH EVERY 7 DAYS   [DISCONTINUED] gabapentin  (NEURONTIN ) 100 MG capsule Take 1 capsule (100 mg total) by mouth 3 (three) times daily.   [DISCONTINUED] glimepiride  (AMARYL ) 2 MG tablet TAKE 1 TABLET(2 MG) BY MOUTH DAILY WITH BREAKFAST   [DISCONTINUED] metFORMIN  (GLUCOPHAGE -XR) 750 MG 24 hr tablet TAKE 1 TABLET(750 MG) BY MOUTH DAILY WITH BREAKFAST   [DISCONTINUED] rosuvastatin  (CRESTOR ) 5 MG tablet TAKE 1 TABLET(5 MG) BY MOUTH DAILY    [DISCONTINUED] VOLNEA  0.15-0.02/0.01 MG (21/5) tablet TAKE 1 TABLET BY MOUTH AT BEDTIME   No facility-administered encounter medications on file as of 02/19/2024.    Surgical History: Past Surgical History:  Procedure Laterality Date   CESAREAN SECTION     GALLBLADDER SURGERY  2011    Medical History: Past Medical History:  Diagnosis Date   Diabetes mellitus without complication (HCC)     Family History: Family History  Problem Relation Age of Onset   Diabetes Mother    Diabetes Maternal Grandmother     Social History   Socioeconomic History   Marital status: Married    Spouse name: Not on file   Number of children: Not on file   Years of education: Not on file   Highest education level: Not on file  Occupational History   Not on file  Tobacco Use   Smoking status: Never   Smokeless tobacco: Never  Vaping Use   Vaping status: Not on file  Substance and Sexual Activity   Alcohol use: Never   Drug use: Never   Sexual activity: Yes    Birth control/protection: Surgical    Comment: BTL  Other Topics Concern   Not on file  Social History Narrative   Not on file   Social Drivers of Health   Financial Resource Strain: Not on file  Food Insecurity: Not on file  Transportation Needs: Not on file  Physical Activity: Not on file  Stress: Not on file  Social Connections: Not on  file  Intimate Partner Violence: Not on file      Review of Systems  Constitutional:  Negative for activity change, appetite change, chills, fatigue, fever and unexpected weight change.  HENT: Negative.  Negative for congestion, ear pain, rhinorrhea, sore throat and trouble swallowing.   Eyes: Negative.   Respiratory: Negative.  Negative for cough, chest tightness, shortness of breath and wheezing.   Cardiovascular: Negative.  Negative for chest pain.  Gastrointestinal: Negative.  Negative for abdominal pain, blood in stool, constipation, diarrhea, nausea and vomiting.  Endocrine:  Negative.   Genitourinary: Negative.  Negative for difficulty urinating, dysuria, frequency, hematuria and urgency.  Musculoskeletal: Negative.  Negative for arthralgias, back pain, joint swelling, myalgias and neck pain.  Skin: Negative.  Negative for rash and wound.  Allergic/Immunologic: Negative.  Negative for immunocompromised state.  Neurological: Negative.  Negative for dizziness, seizures, numbness and headaches.  Hematological: Negative.   Psychiatric/Behavioral: Negative.  Negative for behavioral problems, self-injury and suicidal ideas. The patient is not nervous/anxious.     Vital Signs: BP 136/88   Pulse 84   Temp (!) 96.9 F (36.1 C)   Resp 16   Ht 5' 1 (1.549 m)   Wt 158 lb 6.4 oz (71.8 kg)   SpO2 99%   BMI 29.93 kg/m    Physical Exam Vitals reviewed.  Constitutional:      General: She is awake. She is not in acute distress.    Appearance: Normal appearance. She is well-developed and well-groomed. She is obese. She is not ill-appearing or diaphoretic.  HENT:     Head: Normocephalic and atraumatic.     Right Ear: Tympanic membrane, ear canal and external ear normal.     Left Ear: Tympanic membrane, ear canal and external ear normal.     Nose: Nose normal. No congestion or rhinorrhea.     Mouth/Throat:     Mouth: Mucous membranes are moist.     Pharynx: Oropharynx is clear. No oropharyngeal exudate or posterior oropharyngeal erythema.  Eyes:     General: Lids are normal. Vision grossly intact. Gaze aligned appropriately. No scleral icterus.       Right eye: No discharge.        Left eye: No discharge.     Extraocular Movements: Extraocular movements intact.     Conjunctiva/sclera: Conjunctivae normal.     Pupils: Pupils are equal, round, and reactive to light.  Neck:     Thyroid: No thyromegaly.     Vascular: No JVD.     Trachea: No tracheal deviation.  Cardiovascular:     Rate and Rhythm: Normal rate and regular rhythm.     Pulses: Normal pulses.      Heart sounds: Normal heart sounds. No murmur heard.    No friction rub. No gallop.  Pulmonary:     Effort: Pulmonary effort is normal. No respiratory distress.     Breath sounds: Normal breath sounds. No stridor. No wheezing or rales.  Chest:     Chest wall: No tenderness.  Abdominal:     General: Bowel sounds are normal. There is no distension.     Palpations: Abdomen is soft. There is no mass.     Tenderness: There is no abdominal tenderness. There is no guarding or rebound.  Musculoskeletal:        General: No tenderness or deformity. Normal range of motion.     Cervical back: Normal range of motion and neck supple.  Lymphadenopathy:     Cervical: No  cervical adenopathy.  Skin:    General: Skin is warm and dry.     Capillary Refill: Capillary refill takes less than 2 seconds.     Coloration: Skin is not pale.     Findings: No erythema or rash.  Neurological:     Mental Status: She is alert and oriented to person, place, and time.     Cranial Nerves: No cranial nerve deficit.     Motor: No abnormal muscle tone.     Coordination: Coordination normal.     Gait: Gait normal.     Deep Tendon Reflexes: Reflexes are normal and symmetric.  Psychiatric:        Mood and Affect: Mood normal.        Behavior: Behavior normal. Behavior is cooperative.        Thought Content: Thought content normal.        Judgment: Judgment normal.        Assessment/Plan: 1. Encounter for routine adult health examination with abnormal findings (Primary) Age-appropriate preventive screenings and vaccinations discussed, annual physical exam completed. Routine labs for health maintenance ordered, see below. PHM updated.   - CBC with Differential/Platelet - CMP14+EGFR - Lipid Profile  2. Type 2 diabetes mellitus with other specified complication, without long-term current use of insulin (HCC) A1c is elevated >9. Restart glimepiride  and continue metformin . Referred to ophthalmology for annual diabetic  eye exams. - POCT glycosylated hemoglobin (Hb A1C) - metFORMIN  (GLUCOPHAGE -XR) 750 MG 24 hr tablet; Take 1 tablet (750 mg total) by mouth daily with breakfast.  Dispense: 90 tablet; Refill: 3 - glimepiride  (AMARYL ) 2 MG tablet; TAKE 1 TABLET(2 MG) BY MOUTH DAILY WITH BREAKFAST  Dispense: 90 tablet; Refill: 1 - Ambulatory referral to Ophthalmology  3. Hyperlipidemia associated with type 2 diabetes mellitus (HCC) Routine labs ordered. Continue rosuvastatin  as prescribed. - rosuvastatin  (CRESTOR ) 5 MG tablet; Take 1 tablet (5 mg total) by mouth daily.  Dispense: 90 tablet; Refill: 3 - CBC with Differential/Platelet - CMP14+EGFR - Lipid Profile  4. Neuropathy associated with endocrine disorder (HCC) Cyclobenzaprine  and gabapentin  prescribed - gabapentin  (NEURONTIN ) 100 MG capsule; Take 1 capsule (100 mg total) by mouth 3 (three) times daily.  Dispense: 90 capsule; Refill: 11 - cyclobenzaprine  (FLEXERIL ) 5 MG tablet; Take 1 tablet (5 mg total) by mouth 3 (three) times daily as needed for muscle spasms.  Dispense: 30 tablet; Refill: 5  5. Elevated TSH Routine labs ordered  - TSH + free T4  6. B12 deficiency Routine lab ordered  - B12 and Folate Panel  7. Vitamin D  deficiency Routine lab ordered  - Vitamin D  (25 hydroxy)  8. Encounter for screening mammogram for malignant neoplasm of breast Routine mammogram ordered  - MM 3D SCREENING MAMMOGRAM BILATERAL BREAST; Future    General Counseling: lexa coronado understanding of the findings of todays visit and agrees with plan of treatment. I have discussed any further diagnostic evaluation that may be needed or ordered today. We also reviewed her medications today. she has been encouraged to call the office with any questions or concerns that should arise related to todays visit.    Orders Placed This Encounter  Procedures   MM 3D SCREENING MAMMOGRAM BILATERAL BREAST   CBC with Differential/Platelet   CMP14+EGFR   Lipid Profile    Vitamin D  (25 hydroxy)   TSH + free T4   B12 and Folate Panel   Urine Microalbumin w/creat. ratio   Ambulatory referral to Ophthalmology   POCT glycosylated hemoglobin (Hb A1C)  Meds ordered this encounter  Medications   metFORMIN  (GLUCOPHAGE -XR) 750 MG 24 hr tablet    Sig: Take 1 tablet (750 mg total) by mouth daily with breakfast.    Dispense:  90 tablet    Refill:  3   glimepiride  (AMARYL ) 2 MG tablet    Sig: TAKE 1 TABLET(2 MG) BY MOUTH DAILY WITH BREAKFAST    Dispense:  90 tablet    Refill:  1   rosuvastatin  (CRESTOR ) 5 MG tablet    Sig: Take 1 tablet (5 mg total) by mouth daily.    Dispense:  90 tablet    Refill:  3    Fill new script today   gabapentin  (NEURONTIN ) 100 MG capsule    Sig: Take 1 capsule (100 mg total) by mouth 3 (three) times daily.    Dispense:  90 capsule    Refill:  11    Fill new script today   cyclobenzaprine  (FLEXERIL ) 5 MG tablet    Sig: Take 1 tablet (5 mg total) by mouth 3 (three) times daily as needed for muscle spasms.    Dispense:  30 tablet    Refill:  5    Fill new script today    Return in about 1 month (around 03/20/2024) for F/U, Labs, Dlynn Ranes PCP.   Total time spent:30 Minutes Time spent includes review of chart, medications, test results, and follow up plan with the patient.   [place] Controlled Substance Database was reviewed by me.  This patient was seen by Mardy Maxin, FNP-C in collaboration with Dr. Sigrid Bathe as a part of collaborative care agreement.  Asalee Barrette R. Maxin, MSN, FNP-C Internal medicine

## 2024-02-20 ENCOUNTER — Telehealth: Payer: Self-pay | Admitting: Nurse Practitioner

## 2024-02-20 NOTE — Telephone Encounter (Signed)
 Awaiting 02/19/24 office notes for Ophthalmology referral-Toni

## 2024-02-21 LAB — CBC WITH DIFFERENTIAL/PLATELET
Basophils Absolute: 0.1 x10E3/uL (ref 0.0–0.2)
Basos: 1 %
EOS (ABSOLUTE): 0.2 x10E3/uL (ref 0.0–0.4)
Eos: 2 %
Hematocrit: 42.9 % (ref 34.0–46.6)
Hemoglobin: 13.5 g/dL (ref 11.1–15.9)
Immature Grans (Abs): 0 x10E3/uL (ref 0.0–0.1)
Immature Granulocytes: 0 %
Lymphocytes Absolute: 2.2 x10E3/uL (ref 0.7–3.1)
Lymphs: 25 %
MCH: 27.4 pg (ref 26.6–33.0)
MCHC: 31.5 g/dL (ref 31.5–35.7)
MCV: 87 fL (ref 79–97)
Monocytes Absolute: 0.5 x10E3/uL (ref 0.1–0.9)
Monocytes: 6 %
Neutrophils Absolute: 5.6 x10E3/uL (ref 1.4–7.0)
Neutrophils: 66 %
Platelets: 319 x10E3/uL (ref 150–450)
RBC: 4.92 x10E6/uL (ref 3.77–5.28)
RDW: 12.7 % (ref 11.7–15.4)
WBC: 8.5 x10E3/uL (ref 3.4–10.8)

## 2024-02-21 LAB — CMP14+EGFR
ALT: 22 IU/L (ref 0–32)
AST: 16 IU/L (ref 0–40)
Albumin: 4.4 g/dL (ref 3.9–4.9)
Alkaline Phosphatase: 98 IU/L (ref 44–121)
BUN/Creatinine Ratio: 21 (ref 9–23)
BUN: 15 mg/dL (ref 6–24)
Bilirubin Total: 0.4 mg/dL (ref 0.0–1.2)
CO2: 21 mmol/L (ref 20–29)
Calcium: 9.6 mg/dL (ref 8.7–10.2)
Chloride: 98 mmol/L (ref 96–106)
Creatinine, Ser: 0.72 mg/dL (ref 0.57–1.00)
Globulin, Total: 2.3 g/dL (ref 1.5–4.5)
Glucose: 222 mg/dL — ABNORMAL HIGH (ref 70–99)
Potassium: 4 mmol/L (ref 3.5–5.2)
Sodium: 137 mmol/L (ref 134–144)
Total Protein: 6.7 g/dL (ref 6.0–8.5)
eGFR: 102 mL/min/1.73 (ref >59)

## 2024-02-21 LAB — TSH+FREE T4
Free T4: 1.28 ng/dL (ref 0.82–1.77)
TSH: 4.38 u[IU]/mL (ref 0.450–4.500)

## 2024-02-21 LAB — LIPID PANEL
Chol/HDL Ratio: 6.8 ratio — ABNORMAL HIGH (ref 0.0–4.4)
Cholesterol, Total: 271 mg/dL — ABNORMAL HIGH (ref 100–199)
HDL: 40 mg/dL (ref >39)
LDL Chol Calc (NIH): 193 mg/dL — ABNORMAL HIGH (ref 0–99)
Triglycerides: 201 mg/dL — ABNORMAL HIGH (ref 0–149)
VLDL Cholesterol Cal: 38 mg/dL (ref 5–40)

## 2024-02-21 LAB — B12 AND FOLATE PANEL
Folate: 19.9 ng/mL (ref >3.0)
Vitamin B-12: 672 pg/mL (ref 232–1245)

## 2024-02-21 LAB — VITAMIN D 25 HYDROXY (VIT D DEFICIENCY, FRACTURES): Vit D, 25-Hydroxy: 27.1 ng/mL — ABNORMAL LOW (ref 30.0–100.0)

## 2024-02-22 ENCOUNTER — Ambulatory Visit: Payer: Self-pay | Admitting: Nurse Practitioner

## 2024-02-22 NOTE — Progress Notes (Signed)
 We will discuss the labs at her upcoming office visit in October.

## 2024-02-23 ENCOUNTER — Encounter: Payer: Self-pay | Admitting: Nurse Practitioner

## 2024-02-23 ENCOUNTER — Telehealth: Payer: Self-pay | Admitting: Nurse Practitioner

## 2024-02-23 ENCOUNTER — Other Ambulatory Visit: Payer: Self-pay | Admitting: Nurse Practitioner

## 2024-02-23 DIAGNOSIS — E559 Vitamin D deficiency, unspecified: Secondary | ICD-10-CM

## 2024-02-23 NOTE — Telephone Encounter (Signed)
 Ophthalmology referral faxed to the [facility] due to insurance; [phone]. Notified patient. Gave pt telephone [phone]

## 2024-02-26 ENCOUNTER — Telehealth: Payer: Self-pay | Admitting: Nurse Practitioner

## 2024-02-26 NOTE — Telephone Encounter (Signed)
 lvm notifying patient that now referrals to [facility] ophthalmology cannot be faxed and must be loaded on [facility]. I explained we are not currently set up, but instructions were given to manager to set up account. Once information has been entered, it could still take 2 weeks to be able to send referral. In the meantime, she may call her insurance to see what other ophthalmologist she can go see and let me know.Grace Pena

## 2024-02-26 NOTE — Telephone Encounter (Signed)
 Ophthalmology referral faxed to [facility]; [phone]. Lvm notifying patient. Gave pt telephone [phone]

## 2024-03-01 ENCOUNTER — Telehealth: Payer: Self-pay | Admitting: Nurse Practitioner

## 2024-03-01 NOTE — Telephone Encounter (Signed)
 error

## 2024-03-25 ENCOUNTER — Ambulatory Visit: Admitting: Nurse Practitioner

## 2025-02-19 ENCOUNTER — Encounter: Admitting: Nurse Practitioner
